# Patient Record
Sex: Female | Born: 1970 | Race: White | Hispanic: No | Marital: Married | Smoking: Never smoker
Health system: Southern US, Community
[De-identification: ages and names within clinical notes are randomized; demographics above are authoritative.]

## PROBLEM LIST (undated history)

## (undated) DIAGNOSIS — Z889 Allergy status to unspecified drugs, medicaments and biological substances status: Secondary | ICD-10-CM

## (undated) DIAGNOSIS — F329 Major depressive disorder, single episode, unspecified: Secondary | ICD-10-CM

## (undated) DIAGNOSIS — F32A Depression, unspecified: Secondary | ICD-10-CM

## (undated) DIAGNOSIS — G43909 Migraine, unspecified, not intractable, without status migrainosus: Secondary | ICD-10-CM

---
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
## 1999-07-24 ENCOUNTER — Other Ambulatory Visit: Admission: RE | Admit: 1999-07-24 | Discharge: 1999-07-24 | Payer: Self-pay | Admitting: Gynecology

## 1999-09-16 ENCOUNTER — Other Ambulatory Visit: Admission: RE | Admit: 1999-09-16 | Discharge: 1999-09-16 | Payer: Self-pay | Admitting: Gynecology

## 1999-12-30 ENCOUNTER — Other Ambulatory Visit: Admission: RE | Admit: 1999-12-30 | Discharge: 1999-12-30 | Payer: Self-pay | Admitting: Gynecology

## 2016-01-22 ENCOUNTER — Other Ambulatory Visit: Payer: Self-pay | Admitting: Family Medicine

## 2016-01-22 DIAGNOSIS — E041 Nontoxic single thyroid nodule: Secondary | ICD-10-CM

## 2016-01-29 ENCOUNTER — Ambulatory Visit
Admission: RE | Admit: 2016-01-29 | Discharge: 2016-01-29 | Disposition: A | Discharge status: Home / Self Care | Payer: 59 | Source: Ambulatory Visit | Attending: Family Medicine | Admitting: Family Medicine

## 2016-01-29 DIAGNOSIS — E041 Nontoxic single thyroid nodule: Secondary | ICD-10-CM

## 2016-02-04 ENCOUNTER — Other Ambulatory Visit: Payer: Self-pay | Admitting: Family Medicine

## 2016-02-04 DIAGNOSIS — E041 Nontoxic single thyroid nodule: Secondary | ICD-10-CM

## 2016-02-12 ENCOUNTER — Other Ambulatory Visit (HOSPITAL_COMMUNITY)
Admission: RE | Admit: 2016-02-12 | Discharge: 2016-02-12 | Disposition: A | Discharge status: Home / Self Care | Payer: 59 | Source: Ambulatory Visit | Attending: General Surgery | Admitting: General Surgery

## 2016-02-12 ENCOUNTER — Ambulatory Visit
Admission: RE | Admit: 2016-02-12 | Discharge: 2016-02-12 | Disposition: A | Discharge status: Home / Self Care | Payer: 59 | Source: Ambulatory Visit | Attending: Family Medicine | Admitting: Family Medicine

## 2016-02-12 DIAGNOSIS — E041 Nontoxic single thyroid nodule: Secondary | ICD-10-CM

## 2016-11-25 IMAGING — US US THYROID BIOPSY
1 series · 13 of 13 positions shown · non-contrast
Comparison: US on 01/29/2016

INDICATION: Found to have a right thyroid nodule on recent US. Biposy requested
to determine pathology of this nodule.

EXAM:
ULTRASOUND GUIDED NEEDLE ASPIRATE BIOPSY OF THE RIGHT THYROID GLAND
NODULE

[Series 1: us thyroid biopsy · 0.06mm/px · 13 acquisitions, 13 frames shown]
[im 1/13]
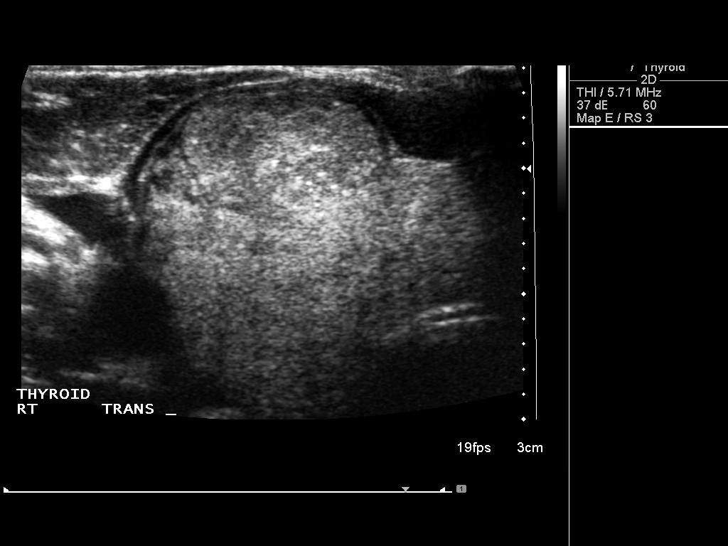
[im 2/13]
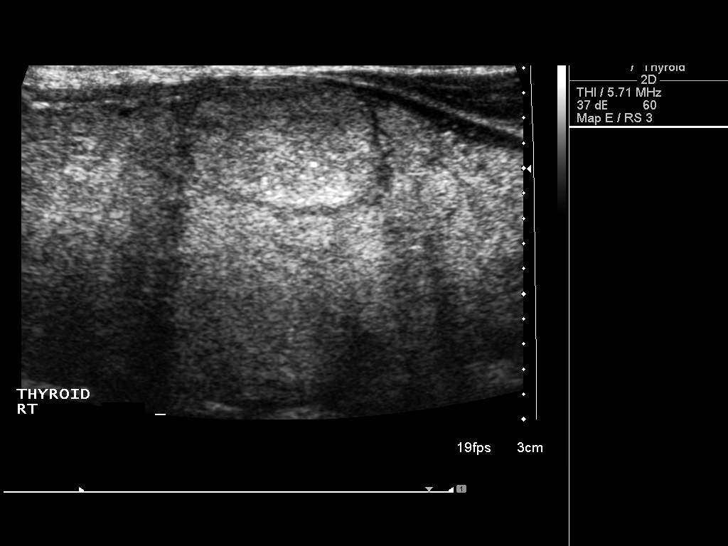
[im 3/13]
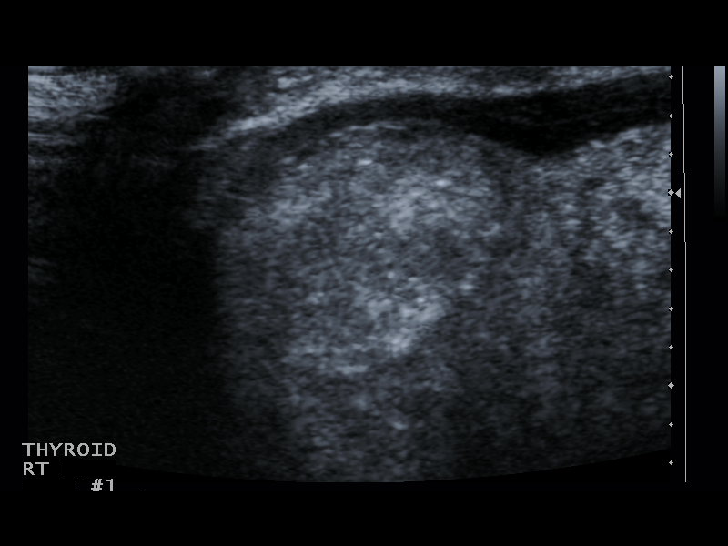
[im 4/13]
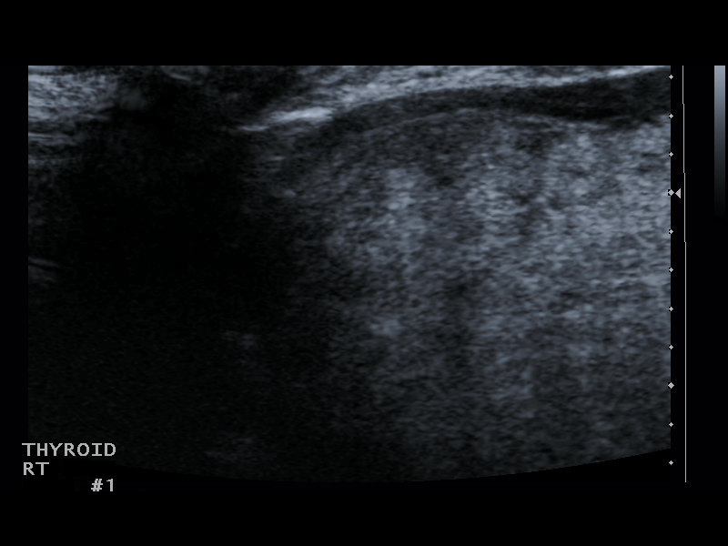
[im 5/13]
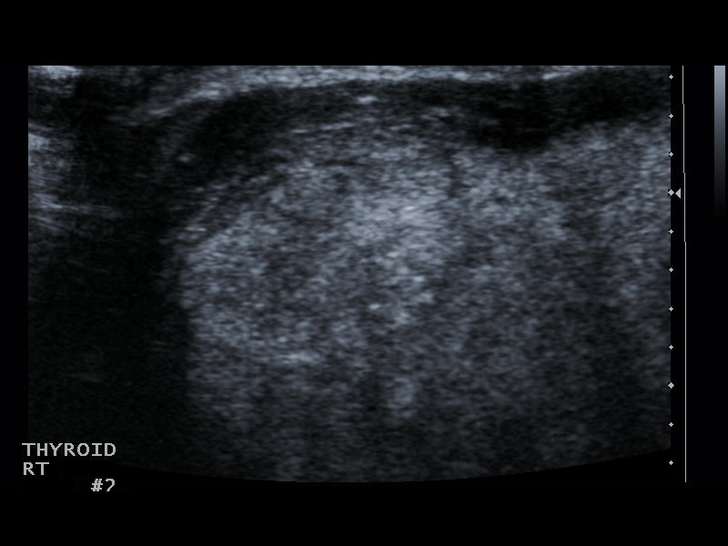
[im 6/13]
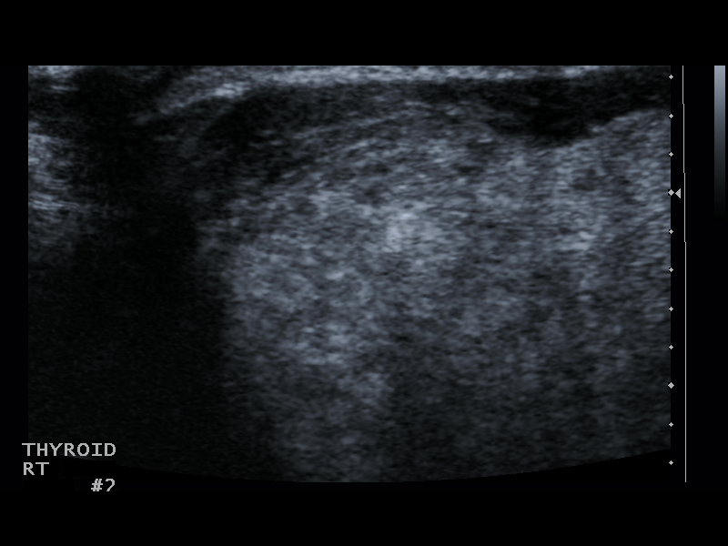
[im 7/13]
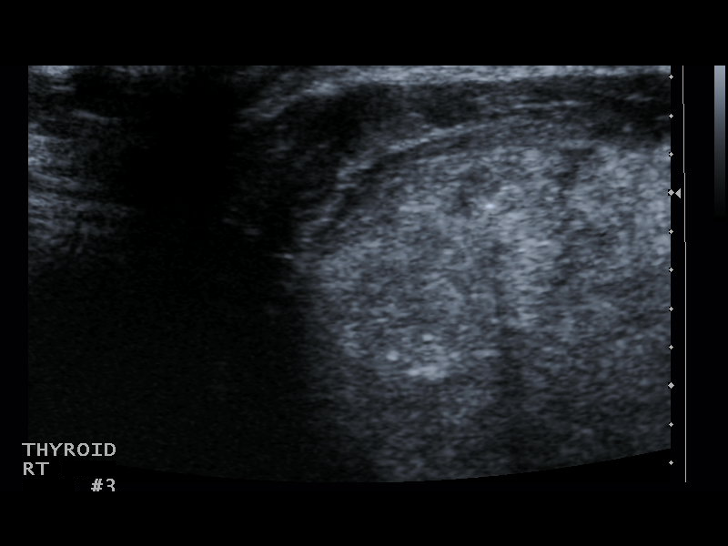
[im 8/13]
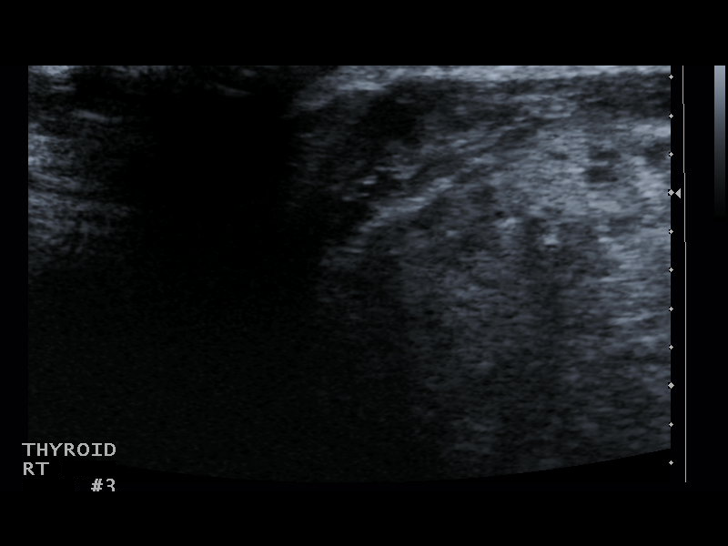
[im 9/13]
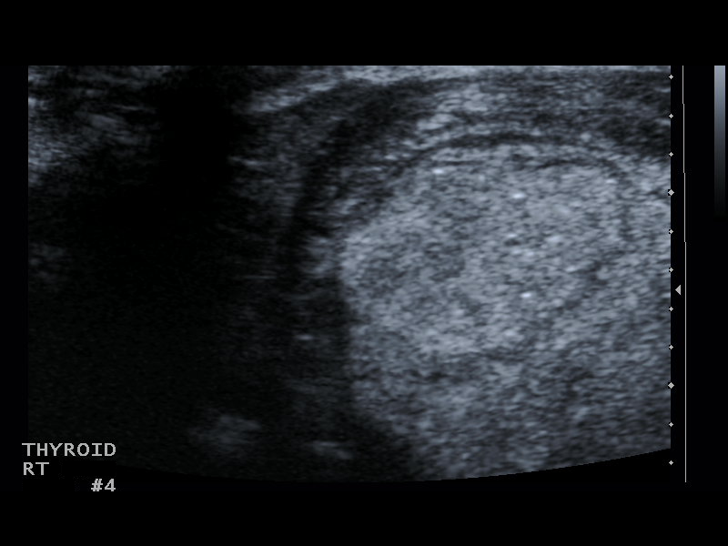
[im 10/13]
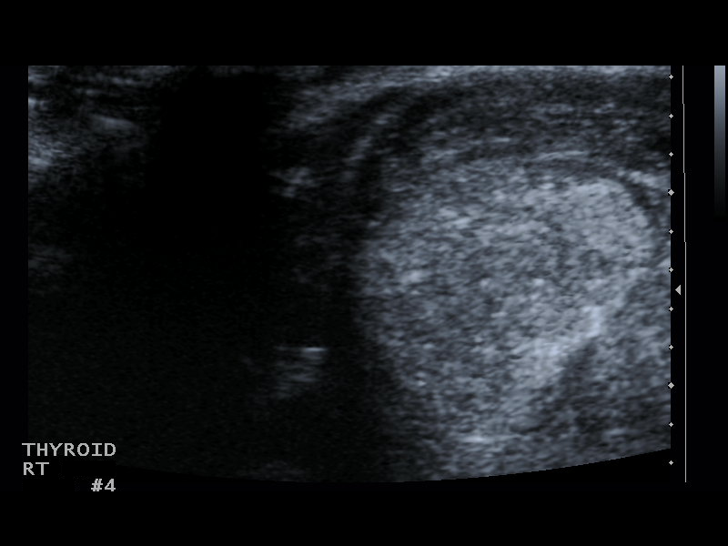
[im 11/13]
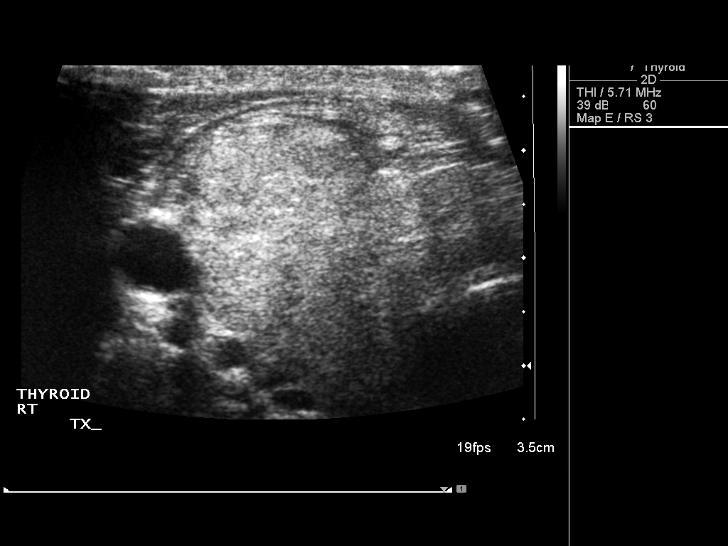
[im 12/13]
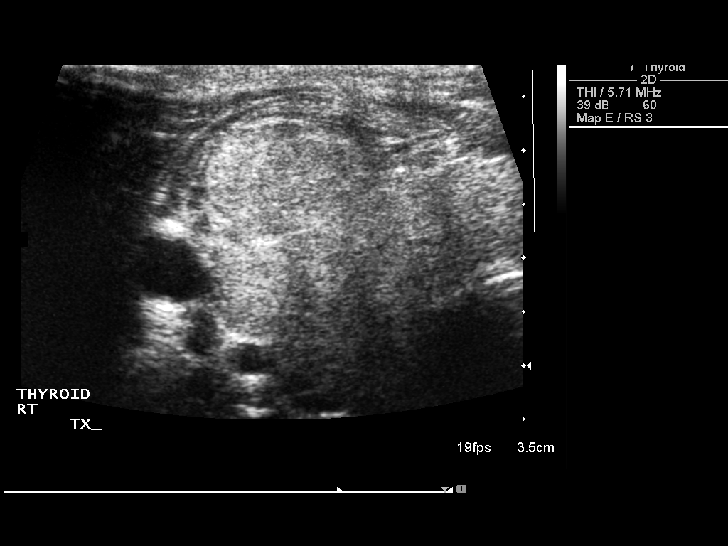
[im 13/13]
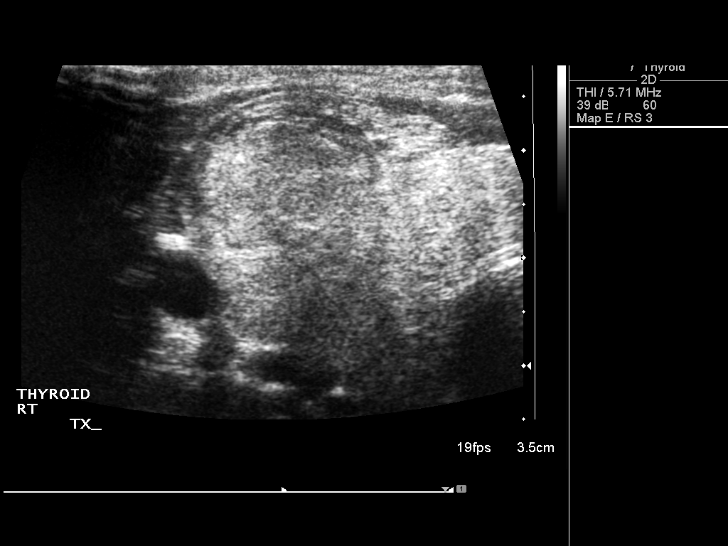

[13 of 13 positions shown; findings below may reference images not displayed]

MEDICATIONS:
1% lidocaine

COMPLICATIONS:
None immediate.

PROCEDURE:
Informed written consent was obtained from the patient after a
thorough discussion of the procedural risks, benefits and
alternatives. All questions were addressed. Maximal Sterile Barrier
Technique was utilized including mask, sterile gloves, sterile
drape, hand hygiene and skin antiseptic. A timeout was performed
prior to the initiation of the procedure.

Ultrasound was performed to localize and mark an adequate site for
the biopsy. The patient was then prepped and draped in a normal
sterile fashion. Local anesthesia was provided with 1% lidocaine.
Using direct ultrasound guidance, 4 passes were made using 25G
needles into the nodule within the right lobe of the thyroid.
Ultrasound was used to confirm needle placements on all occasions.
Specimens were sent to Pathology for analysis.
IMPRESSION: Ultrasound guided needle aspirate biopsy performed of the right
thyroid nodule.

## 2017-01-14 DIAGNOSIS — R0600 Dyspnea, unspecified: Secondary | ICD-10-CM | POA: Diagnosis not present

## 2017-01-14 DIAGNOSIS — R5383 Other fatigue: Secondary | ICD-10-CM | POA: Diagnosis not present

## 2017-01-14 DIAGNOSIS — N644 Mastodynia: Secondary | ICD-10-CM | POA: Diagnosis not present

## 2017-01-14 DIAGNOSIS — E041 Nontoxic single thyroid nodule: Secondary | ICD-10-CM | POA: Diagnosis not present

## 2017-01-18 DIAGNOSIS — R922 Inconclusive mammogram: Secondary | ICD-10-CM | POA: Diagnosis not present

## 2017-01-18 DIAGNOSIS — N644 Mastodynia: Secondary | ICD-10-CM | POA: Diagnosis not present

## 2017-01-24 DIAGNOSIS — Z0000 Encounter for general adult medical examination without abnormal findings: Secondary | ICD-10-CM | POA: Diagnosis not present

## 2017-01-24 DIAGNOSIS — E041 Nontoxic single thyroid nodule: Secondary | ICD-10-CM | POA: Diagnosis not present

## 2017-01-29 DIAGNOSIS — N390 Urinary tract infection, site not specified: Secondary | ICD-10-CM | POA: Diagnosis not present

## 2017-08-08 DIAGNOSIS — J3489 Other specified disorders of nose and nasal sinuses: Secondary | ICD-10-CM | POA: Diagnosis not present

## 2017-08-08 DIAGNOSIS — R5383 Other fatigue: Secondary | ICD-10-CM | POA: Diagnosis not present

## 2018-02-28 DIAGNOSIS — Z0000 Encounter for general adult medical examination without abnormal findings: Secondary | ICD-10-CM | POA: Diagnosis not present

## 2018-02-28 DIAGNOSIS — E041 Nontoxic single thyroid nodule: Secondary | ICD-10-CM | POA: Diagnosis not present

## 2018-05-30 DIAGNOSIS — N951 Menopausal and female climacteric states: Secondary | ICD-10-CM | POA: Diagnosis not present

## 2018-05-30 DIAGNOSIS — N643 Galactorrhea not associated with childbirth: Secondary | ICD-10-CM | POA: Diagnosis not present

## 2018-06-12 DIAGNOSIS — N643 Galactorrhea not associated with childbirth: Secondary | ICD-10-CM | POA: Diagnosis not present

## 2018-06-12 DIAGNOSIS — R922 Inconclusive mammogram: Secondary | ICD-10-CM | POA: Diagnosis not present

## 2018-06-12 DIAGNOSIS — N6489 Other specified disorders of breast: Secondary | ICD-10-CM | POA: Diagnosis not present

## 2018-06-12 DIAGNOSIS — N6452 Nipple discharge: Secondary | ICD-10-CM | POA: Diagnosis not present

## 2018-09-30 DIAGNOSIS — Z23 Encounter for immunization: Secondary | ICD-10-CM | POA: Diagnosis not present

## 2018-11-16 DIAGNOSIS — J019 Acute sinusitis, unspecified: Secondary | ICD-10-CM | POA: Diagnosis not present

## 2019-03-29 DIAGNOSIS — F3340 Major depressive disorder, recurrent, in remission, unspecified: Secondary | ICD-10-CM | POA: Diagnosis not present

## 2019-07-16 ENCOUNTER — Emergency Department (HOSPITAL_BASED_OUTPATIENT_CLINIC_OR_DEPARTMENT_OTHER): Admission: EM | Admit: 2019-07-16 | Payer: Self-pay | Source: Home / Self Care

## 2019-07-16 ENCOUNTER — Other Ambulatory Visit: Payer: Self-pay

## 2019-07-16 ENCOUNTER — Emergency Department (HOSPITAL_BASED_OUTPATIENT_CLINIC_OR_DEPARTMENT_OTHER): Payer: BC Managed Care – PPO

## 2019-07-16 ENCOUNTER — Encounter (HOSPITAL_BASED_OUTPATIENT_CLINIC_OR_DEPARTMENT_OTHER): Payer: Self-pay

## 2019-07-16 ENCOUNTER — Emergency Department (HOSPITAL_COMMUNITY)
Admission: EM | Admit: 2019-07-16 | Discharge: 2019-07-17 | Disposition: A | Discharge status: Home / Self Care | Payer: BC Managed Care – PPO | Source: Home / Self Care | Attending: Emergency Medicine | Admitting: Emergency Medicine

## 2019-07-16 ENCOUNTER — Emergency Department (HOSPITAL_BASED_OUTPATIENT_CLINIC_OR_DEPARTMENT_OTHER)
Admission: EM | Admit: 2019-07-16 | Discharge: 2019-07-16 | Disposition: A | Discharge status: Home / Self Care | Payer: BC Managed Care – PPO | Attending: Emergency Medicine | Admitting: Emergency Medicine

## 2019-07-16 ENCOUNTER — Encounter (HOSPITAL_COMMUNITY): Payer: Self-pay | Admitting: Emergency Medicine

## 2019-07-16 DIAGNOSIS — R1310 Dysphagia, unspecified: Secondary | ICD-10-CM | POA: Insufficient documentation

## 2019-07-16 DIAGNOSIS — K0889 Other specified disorders of teeth and supporting structures: Secondary | ICD-10-CM

## 2019-07-16 DIAGNOSIS — Z79899 Other long term (current) drug therapy: Secondary | ICD-10-CM | POA: Insufficient documentation

## 2019-07-16 DIAGNOSIS — E079 Disorder of thyroid, unspecified: Secondary | ICD-10-CM | POA: Diagnosis not present

## 2019-07-16 DIAGNOSIS — E049 Nontoxic goiter, unspecified: Secondary | ICD-10-CM | POA: Diagnosis not present

## 2019-07-16 HISTORY — DX: Depression, unspecified: F32.A

## 2019-07-16 HISTORY — DX: Migraine, unspecified, not intractable, without status migrainosus: G43.909

## 2019-07-16 HISTORY — DX: Allergy status to unspecified drugs, medicaments and biological substances: Z88.9

## 2019-07-16 LAB — BASIC METABOLIC PANEL
Anion gap: 12 (ref 5–15)
BUN: 9 mg/dL (ref 6–20)
CO2: 23 mmol/L (ref 22–32)
Calcium: 9.2 mg/dL (ref 8.9–10.3)
Chloride: 103 mmol/L (ref 98–111)
Creatinine, Ser: 0.66 mg/dL (ref 0.44–1.00)
GFR calc Af Amer: >60 mL/min (ref >60)
GFR calc non Af Amer: >60 mL/min (ref >60)
Glucose, Bld: 112 mg/dL — ABNORMAL HIGH (ref 70–99)
Potassium: 3.8 mmol/L (ref 3.5–5.1)
Sodium: 138 mmol/L (ref 135–145)

## 2019-07-16 LAB — CBC WITH DIFFERENTIAL/PLATELET
Abs Immature Granulocytes: 0.04 10*3/uL (ref 0.00–0.07)
Basophils Absolute: 0 10*3/uL (ref 0.0–0.1)
Basophils Relative: 0 %
Eosinophils Absolute: 0.1 10*3/uL (ref 0.0–0.5)
Eosinophils Relative: 1 %
HCT: 39.2 % (ref 36.0–46.0)
Hemoglobin: 13 g/dL (ref 12.0–15.0)
Immature Granulocytes: 0 %
Lymphocytes Relative: 9 %
Lymphs Abs: 0.8 10*3/uL (ref 0.7–4.0)
MCH: 32.1 pg (ref 26.0–34.0)
MCHC: 33.2 g/dL (ref 30.0–36.0)
MCV: 96.8 fL (ref 80.0–100.0)
Monocytes Absolute: 0.8 10*3/uL (ref 0.1–1.0)
Monocytes Relative: 8 %
Neutro Abs: 7.9 10*3/uL — ABNORMAL HIGH (ref 1.7–7.7)
Neutrophils Relative %: 82 %
Platelets: 224 10*3/uL (ref 150–400)
RBC: 4.05 MIL/uL (ref 3.87–5.11)
RDW: 12 % (ref 11.5–15.5)
WBC: 9.8 10*3/uL (ref 4.0–10.5)
nRBC: 0 % (ref 0.0–0.2)

## 2019-07-16 LAB — CBG MONITORING, ED: Glucose-Capillary: 97 mg/dL (ref 70–99)

## 2019-07-16 MED ORDER — ONDANSETRON HCL 4 MG/2ML IJ SOLN
4.0000 mg | Freq: Once | INTRAMUSCULAR | Status: AC
  Administered 2019-07-16: 15:00:00 4 mg via INTRAVENOUS
  Filled 2019-07-16: qty 2

## 2019-07-16 MED ORDER — MORPHINE SULFATE (PF) 2 MG/ML IV SOLN
2.0000 mg | Freq: Once | INTRAVENOUS | Status: AC
  Administered 2019-07-16: 16:00:00 2 mg via INTRAVENOUS
  Filled 2019-07-16: qty 1

## 2019-07-16 MED ORDER — HYDROCODONE-ACETAMINOPHEN 5-325 MG PO TABS
1.0000 | ORAL_TABLET | Freq: Once | ORAL | Status: AC
  Administered 2019-07-16: 17:00:00 1 via ORAL
  Filled 2019-07-16: qty 1

## 2019-07-16 MED ORDER — CLINDAMYCIN HCL 150 MG PO CAPS: 450.0000 mg | ORAL_CAPSULE | Freq: Three times a day (TID) | ORAL | 0 refills | Status: AC

## 2019-07-16 MED ORDER — CLINDAMYCIN PHOSPHATE 900 MG/50ML IV SOLN
900.0000 mg | Freq: Once | INTRAVENOUS | Status: AC
  Administered 2019-07-16: 900 mg via INTRAVENOUS
  Filled 2019-07-16: qty 50

## 2019-07-16 MED ORDER — IOHEXOL 300 MG/ML  SOLN
100.0000 mL | Freq: Once | INTRAMUSCULAR | Status: AC | PRN
  Administered 2019-07-16: 75 mL via INTRAVENOUS

## 2019-07-16 MED ORDER — KETOROLAC TROMETHAMINE 30 MG/ML IJ SOLN
30.0000 mg | Freq: Once | INTRAMUSCULAR | Status: AC
  Administered 2019-07-16: 30 mg via INTRAVENOUS
  Filled 2019-07-16: qty 1

## 2019-07-16 NOTE — ED Notes (Signed)
Patient transported to CT 

## 2019-07-16 NOTE — ED Provider Notes (Signed)
[facility] EMERGENCY DEPARTMENT Provider Note   CSN: [id number] Arrival date & time: 07/16/19  1221    History   Chief Complaint Chief Complaint  Patient presents with   Dental Problem    HPI Crystal Callahan is a 48 y.o. female.     HPI   Patient is a 48 year old female who presents the emergency department today complaining of right lower dental pain.  States that she had a fractured tooth and had it removed on 07/14/2019 by a dentist.  Since the procedure she has developed swelling and pain to the right lower part of the mouth.  States it somewhat difficult to swallow.  She was seen by another dentist today who advised her to come to the ED for further work-up and evaluation and potentially IV antibiotics.  She denies any fevers.  She is still able to open her mouth and tolerate her own secretions.  Denies any other symptoms.  Past Medical History:  Diagnosis Date   Depression    Hx of seasonal allergies    Migraine     There are no active problems to display for this patient.   History reviewed. No pertinent surgical history.   OB History   No obstetric history on file.      Home Medications    Prior to Admission medications   Medication Sig Start Date End Date Taking? Authorizing Provider  Ascorbic Acid (VITAMIN C) 100 MG tablet Take by mouth.    [provider]  b complex vitamins tablet Take by mouth.    [provider]  clindamycin (CLEOCIN) 150 MG capsule Take 3 capsules (450 mg total) by mouth 3 (three) times daily for 7 days. 07/16/19 07/23/19  Derricka Mertz S, PA-C  loratadine (CLARITIN) 10 MG tablet Take by mouth.    [provider]  Multiple Vitamins-Minerals (THERA-M) TABS Take by mouth.    [provider]  venlafaxine XR (EFFEXOR-XR) 75 MG 24 hr capsule  02/23/19   [provider]    Family History No family history on file.  Social History Social History   Tobacco Use   Smoking status:  Never Smoker   Smokeless tobacco: Never Used  Substance Use Topics   Alcohol use: Yes    Comment: weekly   Drug use: Never     Allergies   Demerol [meperidine hcl]   Review of Systems Review of Systems  Constitutional: Negative for chills and fever.  HENT: Positive for dental problem and trouble swallowing. Negative for voice change.   Eyes: Negative for visual disturbance.  Respiratory: Negative for shortness of breath.   Cardiovascular: Negative for chest pain.  Gastrointestinal: Negative for abdominal pain, nausea and vomiting.  Genitourinary: Negative for dysuria.  Musculoskeletal: Negative for back pain.  Skin: Negative for color change.  Neurological: Negative for headaches.     Physical Exam Updated Vital Signs BP 127/77 (BP Location: Left Arm)    Pulse 91    Temp 99.1 F (37.3 C) (Axillary)    Resp 12    Ht 5\' 9"  (1.753 m)    Wt 70.3 kg    LMP  (LMP Unknown)    SpO2 100%    BMI 22.89 kg/m   Physical Exam Vitals signs and nursing note reviewed.  Constitutional:      General: She is not in acute distress.    Appearance: She is well-developed.  HENT:     Head: Normocephalic and atraumatic.     Mouth/Throat:  Comments: TTP to right lower gums along surgical site. No obvious fluctuance noted. Torus' noted to the sublingual space. No sublingual edema, minimal TTP. Swelling noted to the right lower face that is TTP. No significant swelling/erythema to the neck  Eyes:     Conjunctiva/sclera: Conjunctivae normal.  Neck:     Musculoskeletal: Neck supple.  Cardiovascular:     Rate and Rhythm: Normal rate and regular rhythm.     Heart sounds: No murmur.  Pulmonary:     Effort: Pulmonary effort is normal. No respiratory distress.     Breath sounds: Normal breath sounds.  Abdominal:     General: Bowel sounds are normal.     Palpations: Abdomen is soft.     Tenderness: There is no abdominal tenderness.  Skin:    General: Skin is warm and dry.  Neurological:       Mental Status: She is alert.     ED Treatments / Results  Labs (all labs ordered are listed, but only abnormal results are displayed) Labs Reviewed  CBC WITH DIFFERENTIAL/PLATELET - Abnormal; Notable for the following components:      Result Value   Neutro Abs 7.9 (*)    All other components within normal limits  BASIC METABOLIC PANEL - Abnormal; Notable for the following components:   Glucose, Bld 112 (*)    All other components within normal limits  CULTURE, BLOOD (ROUTINE X 2)  CULTURE, BLOOD (ROUTINE X 2)  PREGNANCY, URINE  CBG MONITORING, ED    EKG None  Radiology Ct Soft Tissue Neck W Contrast  Result Date: 07/16/2019 CLINICAL DATA:  Recent right tooth extraction 07/14/2019. Now with soft tissue swelling right jaw. Pain. EXAM: CT NECK WITH CONTRAST TECHNIQUE: Multidetector CT imaging of the neck was performed using the standard protocol following the bolus administration of intravenous contrast. CONTRAST:  26Nicole [id number]-RosEra Bumper45R[facility] View Surgery CenterMarcelle OvEstreHenVKenmare Community Hospit69mNicole Kindre16191Swazi1610-Clearlake Era Bumper45[facility] ALPenaMarcelle OvEstreHenVSkyline Ambulatory Surgery Cent63mNicole Kindre16191Swazi1610-OstraEra Bumper45[facility] HospitalMarcelle OvEstreHenVResurgens Fayette Surgery Center L37mNicole [id number]-PardeesvEra Bumper45[facility] HospitalMarcelle OvEstreHenVSt. Claire Regional Medical Cent42mNicole [id number]-North BlenEra Bumper45[facility] Medical CenterMarcelle OvEstreHenVCumberland Valley Surgical Center L12mNicole Kindre16191Swazi1610-GalEra Bumper45[facility] LLCMarcelle OvEstreHenVO'Connor Hospit30mNicole Kindre16191Swazi1610-[place] PEra Bumper45[facility] LLCMarcelle OvEstreHenVSchaumburg Surgery Cent68mNicole [id number]-LauderEra Bumper45[facility] LPMarcelle OvEstreHenVMelville Garey L64mNicole Kindre16191Swazi1610-[place] AEra Bumper45[facility] CenterMarcelle OvEstreHenVMs Methodist Rehabilitation Cent24mNicole Kindre16191Swazi1610-Grand CoEra Bumper45North Dakota Surgery Center LLCMarcelle OvEstreHenVNorth Point Surgery Cent38mNicole Kindre16191Swazi1610-JohnEra Bumper45St. [facility] HospitalMarcelle OvEstreHenVMid [facility] L65mNicole Kindre16191Swazi1610-WhitehEra Bumper45[facility] Surgery CtrMarcelle OvEstreHenVPennsylvania Psychiatric Institu71mNicole Kindre16191Swazi1610-BarEra Bumper45[facility] Medical CenterMarcelle OvEstreHenVEncompass Health Rehabilitation Hospital Of Montgome69mNicole Kindre16191Swazi1610-Kenneth Era Bumper45White River Medical CenterMarcelle OvEstreHenVPark Place Surgical Hospit60mNicole Kindre16191Swazi1610-PampEra Bumper45Faulkner HospitalMarcelle OvEstreHenVMissoula Bone And Joint Surgery Cent74mNicole Kindre16191Swazi1610-RocheEra Bumper45[facility] Memorial HospitalMarcelle OvEstreHenVHouston Medical Cent34mNicole Kindre16191Swazi1610-Glenn SprEra Bumper45[facility]ty HospitalMarcelle OvEstreHenVKennedy Kreiger Institu58mNicole Kindre16191Swazi1610-HerculaEra Bumper45Advanced Surgery CenterMarcelle OvEstreHenVMt [facility] L61mNicole Kindre16191Swazi1610-RoseEra Bumper45Pinckney[facility] Community HospitalMarcelle OvEstreHenVSurgery And Laser Center At Professional [facility] L54mNicole Kindre16191Swazi1610-SlEra Bumper452020 Surgery Center LLCMarcelle OvEstreHenVCommunity Hospit52mNicole Kindre16191Swazi1610-LEra Bumper45B[facility] Medical CenterMarcelle OvEstreHenVHosp San Cristob66mNicole Kindre16191Swazi1610-BeattyvEra Bumper45[facility] HospitalMarcelle OvEstreHenVMackinaw Surgery Center L29mNicole Kindre16191Swazi1610-SEra Bumper45Loring HospitalMarcelle OvEstreHenVBaylor Surgicare At [facility] L8mNicole Kindre16191Swazi1610-TreEra Bumper45[facility] Surgery CenterMarcelle OvEstreHenVJohns Hopkins Hospit3mNicole Kindre16191Swazi1610-NorthEra Bumper45[facility] BartlettMarcelle OvEstreHenVNew Horizons Of [facility] - [facility] Health Cent39mNicole Kindre16191Swazi1610-Three RiEra Bumper45[facility]ists IncMarcelle OvEstreHenVMountain View Hospit76mNicole Kindre16191Swazi1610-OlEra Bumper45[facility] Ii PaMarcelle OvEstreHenVJefferson Community Health Cent14mNicole Kindre16191Swazi1610-Tanque VEra Bumper45[facility] Medical CenterMarcelle OvEstreHenVPiney Orchard Surgery Center [id number] 300 MG/ML  SOLN COMPARISON:  None. FINDINGS: Pharynx and larynx: Normal. No mass or swelling. Salivary glands: No inflammation, mass, or stone. Thyroid: Thyroid goiter with diffuse enlargement of the thyroid. 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 Lymph nodes: No enlarged lymph nodes. Vascular: Normal vascular enhancement. Limited intracranial: Negative Visualized orbits: Negative Mastoids and visualized paranasal sinuses: Negative Skeleton: Recent extraction of right lower molar. No fracture identified. No other areas of acute dental infection. No periapical abscess Upper chest: Negative Other: Mild soft tissue edema and swelling below the right mandible. No soft tissue abscess. IMPRESSION: 1. Recent extraction right lower molar. No fracture or periapical abscess. No soft tissue abscess. There is stranding in the soft tissues below to the mandible on the right which may be bruising or due to cellulitis. 2. Thyroid goiter. 2.5 cm right  thyroid mass recommend thyroid ultrasound and possible biopsy. Electronically Signed   By: Charles  Clark M.D.   On: 07/16/2019 15:23    Procedures Procedures (including critical care time)  Medications Ordered in ED Medications  HYDROcodone-acetaminophen (NORCO/VICODIN) 5-325 MG per tablet 1 tablet (has no administration in time range)  clindamycin (CLEOCIN) IVPB 900 mg (0 mg Intravenous Stopped 07/16/19 1506)  ketorolac (TORADOL) 30 MG/ML injection 30 mg (30 mg Intravenous Given 07/16/19 1409)  iohexol (OMNIPAQUE) 300 MG/ML solution 100 mL (75 mLs Intravenous Contrast Given 07/16/19 1456)  ondansetron (ZOFRAN) injection 4 mg (4 mg Intravenous Given 07/16/19 1519)  morphine 2 MG/ML injection 2 mg (2 mg Intravenous Given 07/16/19 1616)     Initial Impression / Assessment and Plan / ED Course  I have  reviewed the triage vital signs and the nursing notes.  Pertinent labs & imaging results that were available during my care of the patient were reviewed by me and considered in my medical decision making (see chart for details).   Final Clinical Impressions(s) / ED Diagnoses   Final diagnoses:  Pain, dental   Pt underwent dental procedure 3 days ago and had tooth extraction. Has developed pain and swelling to the area and now how swelling to the face. Was seen by a different dentist today and sent to the ED for further eval.   Arrives afebrile, hypertensive, but otherwise normal VS.   On exam, TTP to right lower gums along surgical site. No obvious fluctuance noted. Torus' noted to the sublingual space. No sublingual edema, minimal TTP. Swelling noted to the right lower face that is TTP. No significant swelling/erythema to the neck   Will obtain labs and imaging of the neck. Will give abx and pain meds  CBC without leukocytosis. BMP nonacute  CT soft tissue neck 1. Recent extraction right lower molar. No fracture or periapical abscess. No soft tissue abscess. There is stranding in the soft  tissues below to the mandible on the right which may be bruising or due to cellulitis. 2. Thyroid goiter. 2.5 cm right thyroid mass recommend thyroid ultrasound and possible biopsy.  Discussed case with Dr. Haig Prophet with adult dental who is in agreement that this is less likely Ludwig's and that patient can likely be started on antibiotics and follow-up as an outpatient.  Discussed case with Dr. Hoyt Koch with oral surgery who is in agreement with plan to discharge patient on clindamycin.  He can see her in the office tomorrow if she prefers to follow-up.  On reassessment, swelling has remained stable.  Patient still able to tolerate secretions.  Discussed findings of work-up and plan for discharge on clindamycin.  I also discussed the findings of her thyroid goiter and mass.  She states she has already had ultrasound and biopsy and this is being followed by her primary care doctor.  Gave her information to follow-up with oral surgery tomorrow.  Advised on return precautions.  She voices understanding the plan and reasons return.  All questions answered.  Patient stable for discharge.  ED Discharge Orders         Ordered    clindamycin (CLEOCIN) 150 MG capsule  3 times daily     07/16/19 [address] Shaw Dobek S, PA-C 07/16/19 1641    Lajean Saver, MD 07/17/19 1545

## 2019-07-16 NOTE — ED Provider Notes (Signed)
[facility] DEPT Provider Note   CSN: [id number] Arrival date & time: 07/16/19  2214    History   Chief Complaint Chief Complaint  Patient presents with  . Dental Pain    HPI Crystal Callahan is a 48 y.o. female.     HPI   Crystal Callahan is a 48 y.o. female, with a history of recent dental extraction and migraines, presenting to the ED with persistent right-sided dental and facial pain.  Her pain is severe, throbbing/burning, radiating along the right mandible and right cheek. Patient underwent dental extraction August 1.  She began to have more swelling and pain.  She was unable to get a hold of the original dentist today as their office was closed so this morning she went to another dentist recommended to her by a family member.  This dentist sent her to the ED for further evaluation. She was seen at [facility] where a CT showed no evidence of Ludwig's angioedema or abscess.  She was started on clindamycin and told to follow-up in the office with Dr. Hoyt Koch, oral surgeon, tomorrow.  She presents to the ED today because her pain was unmanageable at home with her instructed ibuprofen and Tylenol.  Denies fever/chills, difficulty swallowing, shortness of breath, chest pain, drooling, facial droop, or any other complaints.    Past Medical History:  Diagnosis Date  . Depression   . Hx of seasonal allergies   . Migraine     There are no active problems to display for this patient.   History reviewed. No pertinent surgical history.   OB History   No obstetric history on file.      Home Medications    Prior to Admission medications   Medication Sig Start Date End Date Taking? Authorizing Provider  Ascorbic Acid (VITAMIN C) 100 MG tablet Take by mouth.    [provider]  b complex vitamins tablet Take by mouth.    [provider]  clindamycin (CLEOCIN) 150 MG capsule Take 3 capsules (450 mg total) by mouth 3  (three) times daily for 7 days. 07/16/19 07/23/19  Couture, Cortni S, PA-C  lidocaine (XYLOCAINE) 2 % solution Use as directed 15 mLs in the mouth or throat as needed for mouth pain. 07/17/19   General Wearing C, PA-C  loratadine (CLARITIN) 10 MG tablet Take by mouth.    [provider]  Multiple Vitamins-Minerals (THERA-M) TABS Take by mouth.    [provider]  ondansetron (ZOFRAN ODT) 4 MG disintegrating tablet Take 1 tablet (4 mg total) by mouth every 8 (eight) hours as needed for nausea or vomiting. 07/17/19   Helyn Schwan C, PA-C  oxyCODONE-acetaminophen (PERCOCET/ROXICET) 5-325 MG tablet Take 1-2 tablets by mouth every 6 (six) hours as needed for severe pain. 07/17/19   Raynie Steinhaus, Helane Gunther, PA-C  venlafaxine XR (EFFEXOR-XR) 75 MG 24 hr capsule  02/23/19   [provider]    Family History No family history on file.  Social History Social History   Tobacco Use  . Smoking status: Never Smoker  . Smokeless tobacco: Never Used  Substance Use Topics  . Alcohol use: Yes    Comment: weekly  . Drug use: Never     Allergies   Demerol [meperidine hcl]   Review of Systems Review of Systems  Constitutional: Negative for chills and fever.  HENT: Positive for dental problem and facial swelling. Negative for drooling and trouble swallowing.   Respiratory: Negative for shortness  of breath.   Cardiovascular: Negative for chest pain.  Gastrointestinal: Negative for nausea and vomiting.  Neurological: Negative for dizziness and light-headedness.  All other systems reviewed and are negative.    Physical Exam Updated Vital Signs BP (!) 146/96 (BP Location: Right Arm)   Pulse 91   Temp 99 F (37.2 C) (Oral)   Resp 19   LMP  (LMP Unknown)   SpO2 100%   Physical Exam Vitals signs and nursing note reviewed.  Constitutional:      General: She is not in acute distress.    Appearance: She is well-developed. She is not diaphoretic.  HENT:     Head: Normocephalic and atraumatic.      Mouth/Throat:     Mouth: Mucous membranes are moist.     Pharynx: Oropharynx is clear.     Comments: Tenderness to the right mandibular gingiva with some swelling.  No noted fluctuance.   Extraction site appears to be in the region of right mandibular molar.  No hemorrhage.  Tenderness and swelling to the right cheek.  There is tenderness along the mandible as well.  No trismus or noted abnormal phonation.  Mouth opening to at least 3 finger widths.  Handles oral secretions without difficulty.  No sublingual swelling.  No swelling or tenderness to the submental or submandibular regions.  No swelling or tenderness into the soft tissues of the neck. Eyes:     Conjunctiva/sclera: Conjunctivae normal.  Neck:     Musculoskeletal: Neck supple.  Cardiovascular:     Rate and Rhythm: Normal rate and regular rhythm.     Pulses: Normal pulses.          Radial pulses are 2+ on the right side and 2+ on the left side.     Comments: Tactile temperature in the extremities appropriate and equal bilaterally. Pulmonary:     Effort: Pulmonary effort is normal. No respiratory distress.  Abdominal:     Tenderness: There is no guarding.  Musculoskeletal:     Right lower leg: No edema.     Left lower leg: No edema.  Lymphadenopathy:     Cervical: No cervical adenopathy.  Skin:    General: Skin is warm and dry.  Neurological:     Mental Status: She is alert.  Psychiatric:        Mood and Affect: Mood and affect normal.        Speech: Speech normal.        Behavior: Behavior normal.      ED Treatments / Results  Labs (all labs ordered are listed, but only abnormal results are displayed) Labs Reviewed - No data to display  EKG None  Radiology Ct Soft Tissue Neck W Contrast  Result Date: 07/16/2019 CLINICAL DATA:  Recent right tooth extraction 07/14/2019. Now with soft tissue swelling right jaw. Pain. EXAM: CT NECK WITH CONTRAST TECHNIQUE: Multidetector CT imaging of the neck was performed  using the standard protocol following the bolus administration of intravenous contrast. CONTRAST:  75mL OMNIPAQUE IOHEXOL 300 MG/ML  SOLN COMPARISON:  None. FINDINGS: Pharynx and larynx: Normal. No mass or swelling. Salivary glands: No inflammation, mass, or stone. Thyroid: Thyroid goiter with diffuse enlargement of the thyroid. 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 Lymph nodes: No enlarged lymph nodes. Vascular: Normal vascular enhancement. Limited intracranial: Negative Visualized orbits: Negative Mastoids and visualized paranasal sinuses: Negative Skeleton: Recent extraction of right lower molar. No fracture identified. No other areas of acute dental infection. No  periapical abscess Upper chest: Negative Other: Mild soft tissue edema and swelling below the right mandible. No soft tissue abscess. IMPRESSION: 1. Recent extraction right lower molar. No fracture or periapical abscess. No soft tissue abscess. There is stranding in the soft tissues below to the mandible on the right which may be bruising or due to cellulitis. 2. Thyroid goiter. 2.5 cm right thyroid mass recommend thyroid ultrasound and possible biopsy. Electronically Signed   By: Marlan Palauharles  Clark M.D.   On: 07/16/2019 15:23    Procedures Dental Block  Date/Time: 07/17/2019 12:02 AM Performed by: Anselm PancoastJoy, Maddi Collar C, PA-C Authorized by: Anselm PancoastJoy, Laythan Hayter C, PA-C   Consent:    Consent obtained:  Verbal   Consent given by:  Patient   Risks discussed:  Swelling, unsuccessful block and pain Indications:    Indications: dental pain   Location:    Block type:  Inferior alveolar   Laterality:  Right Procedure details (see MAR for exact dosages):    Topical anesthetic:  Benzocaine gel   Syringe type:  Controlled syringe   Needle gauge:  27 G   Anesthetic injected:  Bupivacaine 0.5% WITH epi   Injection procedure:  Anatomic landmarks identified, anatomic landmarks palpated, introduced needle, negative aspiration for blood and incremental  injection Post-procedure details:    Outcome:  Anesthesia achieved   Patient tolerance of procedure:  Tolerated well, no immediate complications .Nerve Block  Date/Time: 07/17/2019 12:13 AM Performed by: Anselm PancoastJoy, Rubye Strohmeyer C, PA-C Authorized by: Anselm PancoastJoy, Taliana Mersereau C, PA-C   Consent:    Consent obtained:  Verbal   Consent given by:  Patient   Risks discussed:  Swelling, unsuccessful block, pain and bleeding Indications:    Indications:  Pain relief Location:    Body area:  Head   Head nerve blocked: Buccal.   Laterality:  Right Procedure details (see MAR for exact dosages):    Block needle gauge:  27 G   Anesthetic injected:  Bupivacaine 0.5% WITH epi   Injection procedure:  Anatomic landmarks identified, anatomic landmarks palpated, incremental injection, introduced needle and negative aspiration for blood Post-procedure details:    Outcome:  Anesthesia achieved   Patient tolerance of procedure:  Tolerated well, no immediate complications Comments:     Following the inferior alveolar block, patient had adequate anesthesia to the right dentition and gingival surfaces, however, she then was able to isolate additional pain to the right cheek and buccal surfaces.  For this reason, buccal block was also performed.   (including critical care time)  Medications Ordered in ED Medications  oxyCODONE-acetaminophen (PERCOCET/ROXICET) 5-325 MG per tablet 2 tablet (2 tablets Oral Given 07/17/19 0020)  ondansetron (ZOFRAN-ODT) disintegrating tablet 4 mg (4 mg Oral Given 07/17/19 0020)  bupivacaine-epinephrine (MARCAINE W/ EPI) 0.5% -1:200000 injection 1.8 mL (1.8 mLs Infiltration Given 07/17/19 0020)     Initial Impression / Assessment and Plan / ED Course  I have reviewed the triage vital signs and the nursing notes.  Pertinent labs & imaging results that were available during my care of the patient were reviewed by me and considered in my medical decision making (see chart for details).        Patient  presents with uncontrolled right-sided dental pain.  Low suspicion for Ludwig's angina edema or sepsis.  We were able to gain control of the patient's pain using nerve blocks.  She was given an invitation for follow-up with the oral surgeon, Dr. Barbette MerinoJensen, during her ED visit this morning.  She was encouraged to call  to solidify this follow-up. The patient was given instructions for home care as well as return precautions. Patient voices understanding of these instructions, accepts the plan, and is comfortable with discharge.     Final Clinical Impressions(s) / ED Diagnoses   Final diagnoses:  Pain, dental    ED Discharge Orders         Ordered    oxyCODONE-acetaminophen (PERCOCET/ROXICET) 5-325 MG tablet  Every 6 hours PRN     07/17/19 0026    ondansetron (ZOFRAN ODT) 4 MG disintegrating tablet  Every 8 hours PRN     07/17/19 0026    lidocaine (XYLOCAINE) 2 % solution  As needed     07/17/19 0026           Anselm PancoastJoy, Takao Lizer C, PA-C 07/17/19 0149    Geoffery Lyonselo, [place], MD 07/17/19 [phone]

## 2019-07-16 NOTE — ED Notes (Signed)
C/o Dental pain

## 2019-07-16 NOTE — ED Triage Notes (Signed)
Patient here from home with complaints of right lower dental pain. Reports that she had a tooth removed and has since had numbness pain and swelling to the area. Seen recently for same on today. Given antibiotics and pain medication.

## 2019-07-16 NOTE — ED Triage Notes (Addendum)
Pt reports she had tooth extraction  #31 on 8/1-c/o swelling, pain and difficulty swallowing-was seen by another dentist today and sent to ED-NAD-steady gait-pt has noted swelling to right anterior neck that she states is her thyroid/baseline

## 2019-07-16 NOTE — Discharge Instructions (Signed)
You were given a prescription for antibiotics. Please take the antibiotic prescription fully.   Please follow-up with Dr. Hoyt Koch in the office tomorrow.  Please call the office to schedule an appointment.   Please return to the emergency department for any new or worsening symptoms.

## 2019-07-17 MED ORDER — LIDOCAINE VISCOUS HCL 2 % MT SOLN: 15.0000 mL | OROMUCOSAL | 2 refills | Status: DC | PRN

## 2019-07-17 MED ORDER — OXYCODONE-ACETAMINOPHEN 5-325 MG PO TABS: 1.0000 | ORAL_TABLET | Freq: Four times a day (QID) | ORAL | 0 refills | Status: DC | PRN

## 2019-07-17 MED ORDER — ONDANSETRON 4 MG PO TBDP: 4.0000 mg | ORAL_TABLET | Freq: Three times a day (TID) | ORAL | 0 refills | Status: DC | PRN

## 2019-07-17 MED ORDER — OXYCODONE-ACETAMINOPHEN 5-325 MG PO TABS
2.0000 | ORAL_TABLET | Freq: Once | ORAL | Status: AC
  Administered 2019-07-17: 2 via ORAL
  Filled 2019-07-17: qty 2

## 2019-07-17 MED ORDER — BUPIVACAINE-EPINEPHRINE (PF) 0.5% -1:200000 IJ SOLN
1.8000 mL | Freq: Once | INTRAMUSCULAR | Status: AC
  Administered 2019-07-17: 1.8 mL
  Filled 2019-07-17: qty 1.8

## 2019-07-17 MED ORDER — ONDANSETRON 4 MG PO TBDP
4.0000 mg | ORAL_TABLET | Freq: Once | ORAL | Status: AC
  Administered 2019-07-17: 4 mg via ORAL
  Filled 2019-07-17: qty 1

## 2019-07-17 NOTE — Discharge Instructions (Signed)
°  Dental Pain Use the viscous lidocaine for mouth pain. Swish with the lidocaine and spit it out. Do not swallow it. You should also swish with a homemade salt water solution, twice a day.  Make this solution by mixing 8 ounces of warm water with about half a teaspoon of salt. Antiinflammatory medications: Take 600 mg of ibuprofen every 6 hours or 440 mg (over the counter dose) to 500 mg (prescription dose) of naproxen every 12 hours for the next 3 days. After this time, these medications may be used as needed for pain. Take these medications with food to avoid upset stomach. Choose only one of these medications, do not take them together. Acetaminophen (generic for Tylenol): Should you continue to have additional pain while taking the ibuprofen or naproxen, you may add in acetaminophen as needed. Your daily total maximum amount of acetaminophen from all sources should be limited to 4000mg /day for persons without liver problems, or 2000mg /day for those with liver problems. Percocet: May take Percocet (oxycodone-acetaminophen) as needed for severe pain.   Do not drive or perform other dangerous activities while taking this medication as it can cause drowsiness as well as changes in reaction time and judgement.  Please note that each pill of Percocet contains 325 mg of acetaminophen (generic for Tylenol) and the above dosage limits apply.  Nausea/vomiting: Use the ondansetron (generic for Zofran) for nausea or vomiting.  This medication may not prevent all vomiting or nausea, but can help facilitate better hydration. Things that can help with nausea/vomiting also include peppermint/menthol candies, vitamin B12, and ginger.  Please take all of your antibiotics until finished!   You may develop abdominal discomfort or diarrhea from the antibiotic.  You may help offset this with probiotics which you can buy or get in yogurt. Do not eat or take the probiotics until 2 hours after your antibiotic.   For  prescription assistance, may try using prescription discount sites or apps, such as goodrx.com

## 2019-07-21 LAB — CULTURE, BLOOD (ROUTINE X 2)
Culture: NO GROWTH
Culture: NO GROWTH
Special Requests: ADEQUATE
Special Requests: ADEQUATE

## 2019-10-01 ENCOUNTER — Other Ambulatory Visit: Payer: Self-pay | Admitting: Family Medicine

## 2019-10-01 DIAGNOSIS — Z13220 Encounter for screening for lipoid disorders: Secondary | ICD-10-CM | POA: Diagnosis not present

## 2019-10-01 DIAGNOSIS — E049 Nontoxic goiter, unspecified: Secondary | ICD-10-CM

## 2019-10-01 DIAGNOSIS — Z0000 Encounter for general adult medical examination without abnormal findings: Secondary | ICD-10-CM | POA: Diagnosis not present

## 2019-10-01 DIAGNOSIS — E041 Nontoxic single thyroid nodule: Secondary | ICD-10-CM | POA: Diagnosis not present

## 2019-10-08 ENCOUNTER — Ambulatory Visit
Admission: RE | Admit: 2019-10-08 | Discharge: 2019-10-08 | Disposition: A | Discharge status: Home / Self Care | Payer: BC Managed Care – PPO | Source: Ambulatory Visit | Attending: Family Medicine | Admitting: Family Medicine

## 2019-10-08 DIAGNOSIS — E049 Nontoxic goiter, unspecified: Secondary | ICD-10-CM

## 2019-10-08 DIAGNOSIS — E041 Nontoxic single thyroid nodule: Secondary | ICD-10-CM | POA: Diagnosis not present

## 2019-10-16 ENCOUNTER — Other Ambulatory Visit: Payer: Self-pay | Admitting: Family Medicine

## 2019-10-16 DIAGNOSIS — Z1231 Encounter for screening mammogram for malignant neoplasm of breast: Secondary | ICD-10-CM | POA: Diagnosis not present

## 2019-10-16 DIAGNOSIS — E041 Nontoxic single thyroid nodule: Secondary | ICD-10-CM

## 2019-10-30 ENCOUNTER — Ambulatory Visit
Admission: RE | Admit: 2019-10-30 | Discharge: 2019-10-30 | Disposition: A | Discharge status: Home / Self Care | Payer: BC Managed Care – PPO | Source: Ambulatory Visit | Attending: Family Medicine | Admitting: Family Medicine

## 2019-10-30 ENCOUNTER — Other Ambulatory Visit (HOSPITAL_COMMUNITY)
Admission: RE | Admit: 2019-10-30 | Discharge: 2019-10-30 | Disposition: A | Discharge status: Home / Self Care | Payer: BC Managed Care – PPO | Source: Ambulatory Visit | Attending: Family Medicine | Admitting: Family Medicine

## 2019-10-30 DIAGNOSIS — E041 Nontoxic single thyroid nodule: Secondary | ICD-10-CM | POA: Insufficient documentation

## 2019-10-30 DIAGNOSIS — E0789 Other specified disorders of thyroid: Secondary | ICD-10-CM | POA: Diagnosis not present

## 2019-10-31 LAB — CYTOLOGY - NON PAP

## 2019-12-20 ENCOUNTER — Other Ambulatory Visit: Payer: Self-pay | Admitting: Family Medicine

## 2019-12-20 DIAGNOSIS — R109 Unspecified abdominal pain: Secondary | ICD-10-CM

## 2019-12-21 ENCOUNTER — Ambulatory Visit (INDEPENDENT_AMBULATORY_CARE_PROVIDER_SITE_OTHER): Payer: BC Managed Care – PPO

## 2019-12-21 ENCOUNTER — Other Ambulatory Visit: Payer: Self-pay | Admitting: Family Medicine

## 2019-12-21 ENCOUNTER — Other Ambulatory Visit: Payer: Self-pay

## 2019-12-21 DIAGNOSIS — R109 Unspecified abdominal pain: Secondary | ICD-10-CM | POA: Diagnosis not present

## 2019-12-21 MED ORDER — IOHEXOL 300 MG/ML  SOLN
100.0000 mL | Freq: Once | INTRAMUSCULAR | Status: AC | PRN
  Administered 2019-12-21: 10:00:00 100 mL via INTRAVENOUS

## 2019-12-24 ENCOUNTER — Other Ambulatory Visit: Payer: Self-pay

## 2019-12-24 ENCOUNTER — Other Ambulatory Visit: Payer: Self-pay | Admitting: Family Medicine

## 2019-12-24 ENCOUNTER — Ambulatory Visit (INDEPENDENT_AMBULATORY_CARE_PROVIDER_SITE_OTHER): Payer: BC Managed Care – PPO

## 2019-12-24 DIAGNOSIS — R102 Pelvic and perineal pain: Secondary | ICD-10-CM

## 2019-12-24 DIAGNOSIS — R9389 Abnormal findings on diagnostic imaging of other specified body structures: Secondary | ICD-10-CM

## 2019-12-24 DIAGNOSIS — N83292 Other ovarian cyst, left side: Secondary | ICD-10-CM | POA: Diagnosis not present

## 2019-12-28 DIAGNOSIS — Z03818 Encounter for observation for suspected exposure to other biological agents ruled out: Secondary | ICD-10-CM | POA: Diagnosis not present

## 2020-01-03 DIAGNOSIS — Z03818 Encounter for observation for suspected exposure to other biological agents ruled out: Secondary | ICD-10-CM | POA: Diagnosis not present

## 2020-01-10 DIAGNOSIS — Z03818 Encounter for observation for suspected exposure to other biological agents ruled out: Secondary | ICD-10-CM | POA: Diagnosis not present

## 2020-06-23 DIAGNOSIS — H6980 Other specified disorders of Eustachian tube, unspecified ear: Secondary | ICD-10-CM | POA: Diagnosis not present

## 2020-06-23 DIAGNOSIS — R42 Dizziness and giddiness: Secondary | ICD-10-CM | POA: Diagnosis not present

## 2020-08-12 IMAGING — US US FNA BIOPSY THYROID 1ST LESION
1 series · 12 of 12 positions shown · non-contrast
Comparison: US Thyroid 10/08/19 and

INDICATION: Indeterminate thyroid nodule

Right thyroid nodule
2.6 cm
EXAM:
ULTRASOUND GUIDED FINE NEEDLE ASPIRATION OF INDETERMINATE THYROID
NODULE
TECHNIQUE: Informed written consent was obtained from the patient after a
discussion of the risks, benefits and alternatives to treatment.
Questions regarding the procedure were encouraged and answered. A
timeout was performed prior to the initiation of the procedure.

[Series 1: us fna biopsy thyroid 1st lesion · 0.06mm/px · 12 acquisitions, 12 frames shown]
[im 1/12]
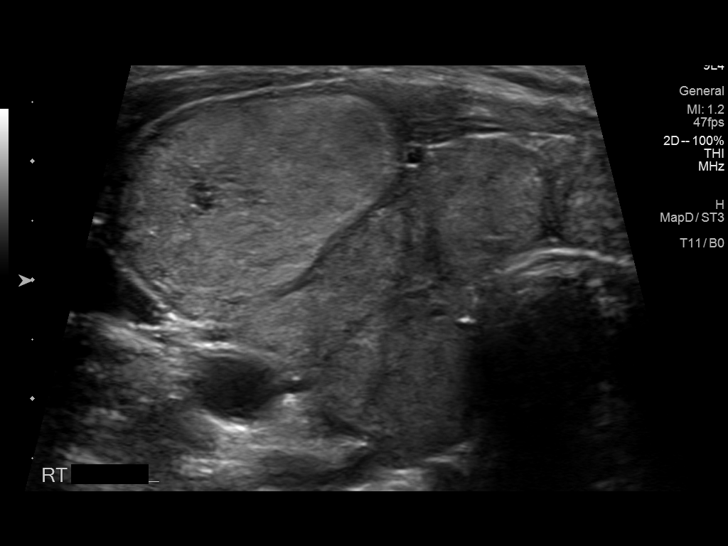
[im 2/12]
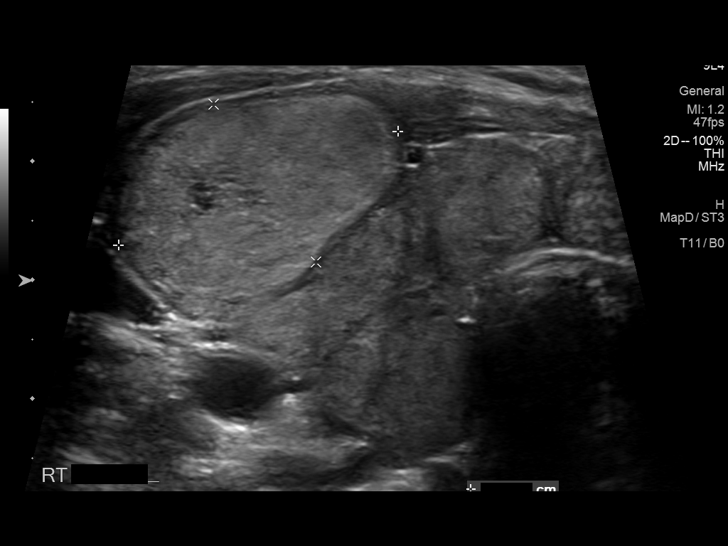
[im 3/12]
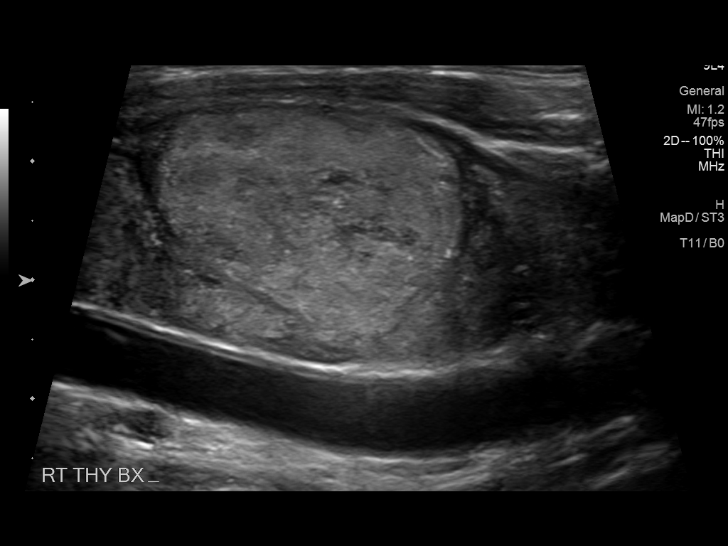
[im 4/12]
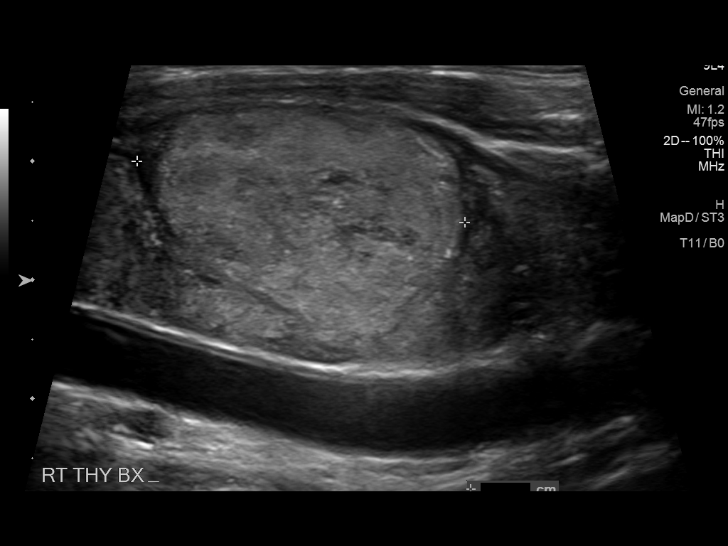
[im 5/12]
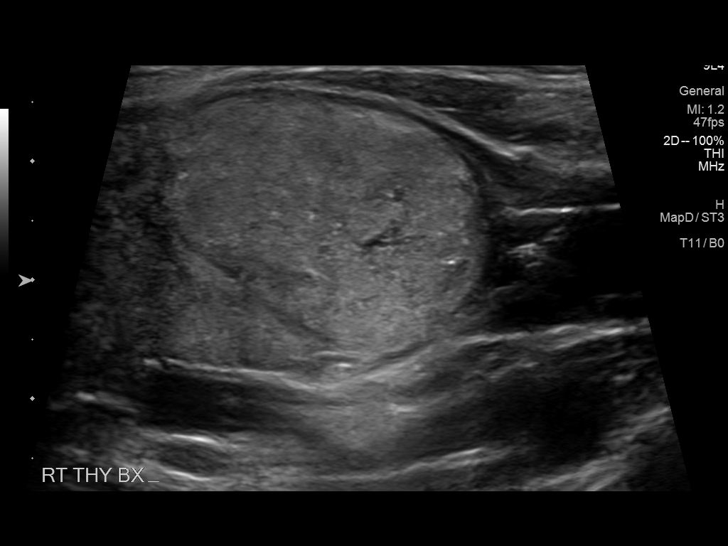
[im 6/12]
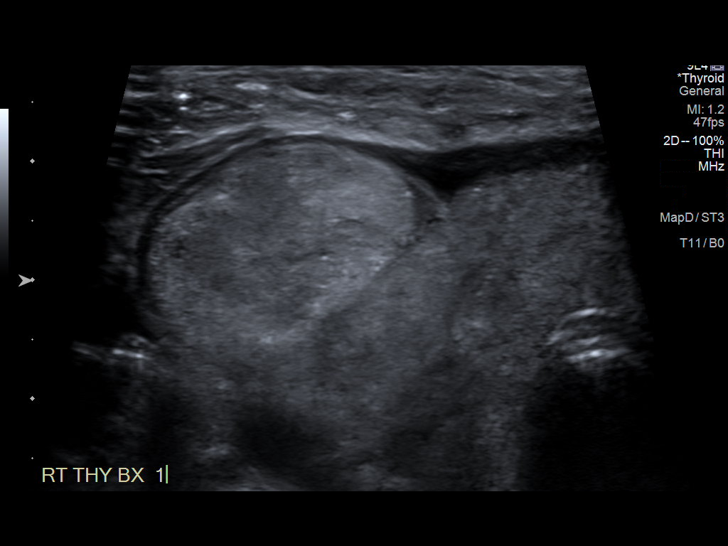
[im 7/12]
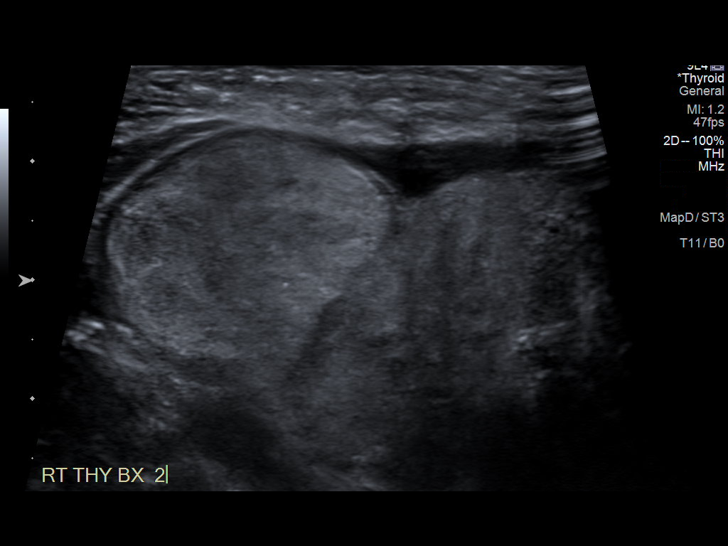
[im 8/12]
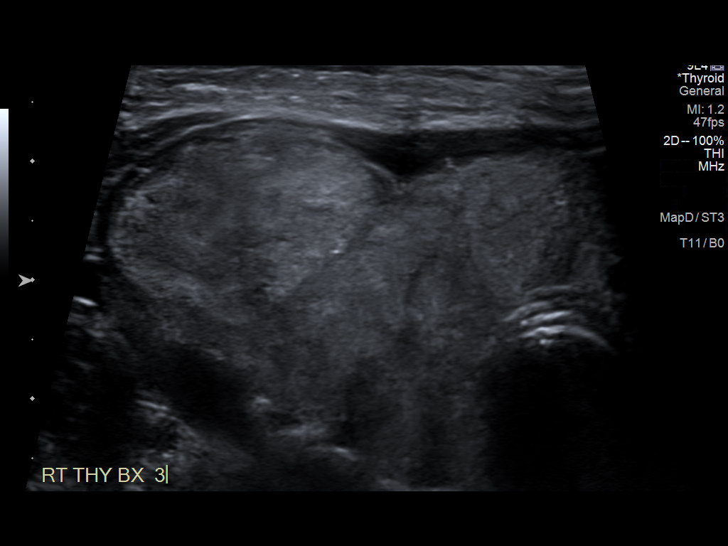
[im 9/12]
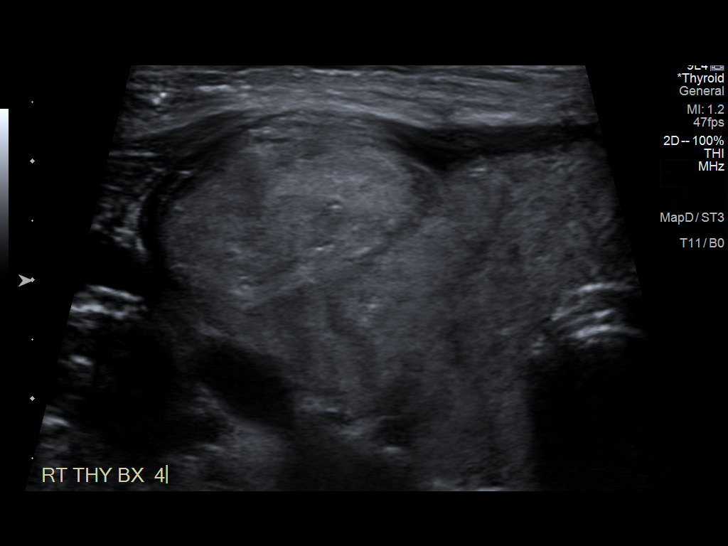
[im 10/12]
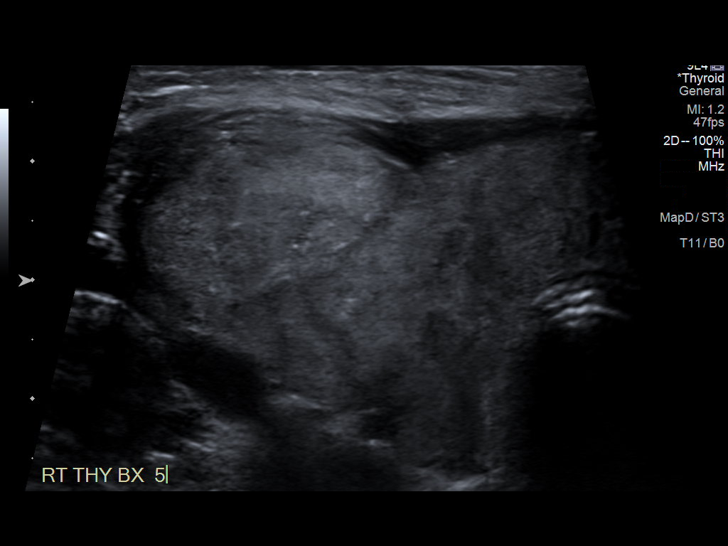
[im 11/12]
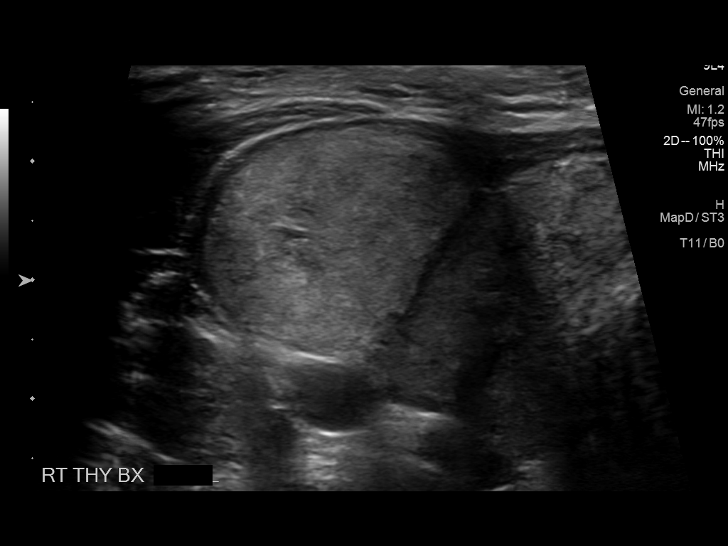
[im 12/12]
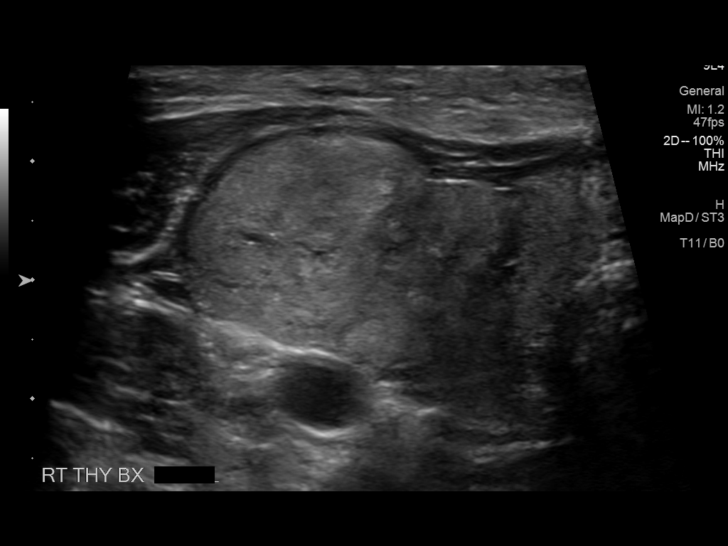

[12 of 12 positions shown; findings below may reference images not displayed]

Biopsy same nodule 02/12/16:

Diagnosis

THYROID, FINE NEEDLE ASPIRATION, RMP TO RLP (SPECIMEN 1 OF 1,
COLLECTED ON 02/12/16):

CONSISTENT WITH BENIGN FOLLICULAR NODULE ([REDACTED] CATEGORY II).

MEDICATIONS:
8 cc 1% lidocaine

COMPLICATIONS:
None immediate.
Pre-procedural ultrasound scanning demonstrated enlarging right
thyroid nodule

The procedure was planned. The neck was prepped in the usual sterile
fashion, and a sterile drape was applied covering the operative
field. A timeout was performed prior to the initiation of the
procedure. Local anesthesia was provided with 1% lidocaine.

Under direct ultrasound guidance, 5 FNA biopsies were performed of
the right thyroid nodule with a 25 gauge needle.

2 of the samples were obtained for AFIRMA as per ordering EDNA

The samples were prepared and submitted to pathology.

Limited post procedural scanning was negative for hematoma or
additional complication. Dressings were placed. The patient
tolerated the above procedures procedure well without immediate
postprocedural complication.
IMPRESSION: Technically successful ultrasound guided fine needle aspiration of
right thyroid nodule

Read by

Hajdinn Neumeister

## 2020-10-03 IMAGING — CT CT ABD-PELV W/ CM
2 of 5 series · 15 of 46 positions shown, 17 images · IV contrast (APPLIED)
Comparison: None.

CLINICAL DATA: Lower abdominal pain with nausea

EXAM:
CT ABDOMEN AND PELVIS WITH CONTRAST
TECHNIQUE: Multidetector CT imaging of the abdomen and pelvis was performed
using the standard protocol following bolus administration of
intravenous contrast. Oral contrast was also administered.
CONTRAST:  100mL OMNIPAQUE IOHEXOL 300 MG/ML  SOLN

[Series 2: axial st · axial · 0.85mm/px · z∈[-514,-74]mm · 12 of 98 slices shown, 14 images]
[im 5/98  soft-tissue]
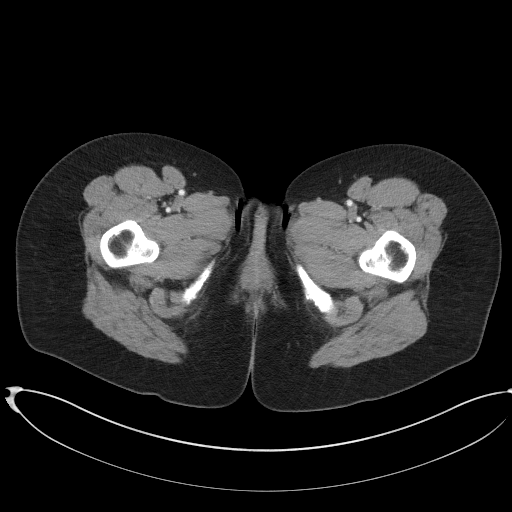
[im 5/98  bone]
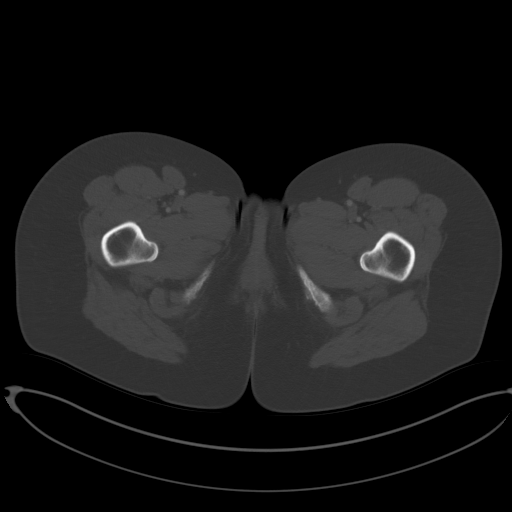
[im 15/98  soft-tissue]
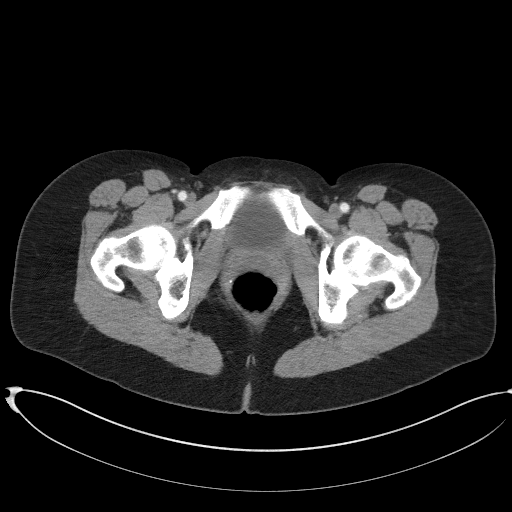
[im 20/98  soft-tissue]
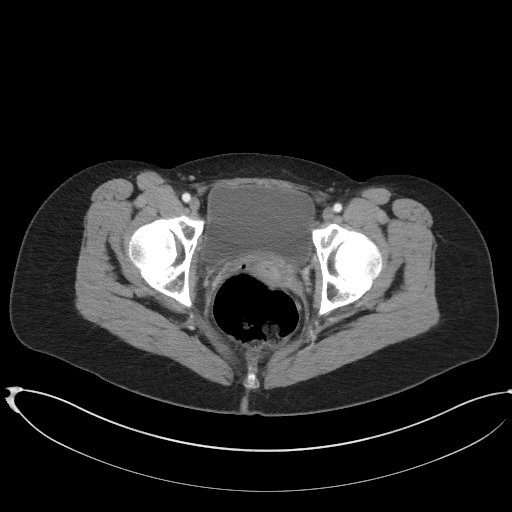
[im 30/98  soft-tissue]
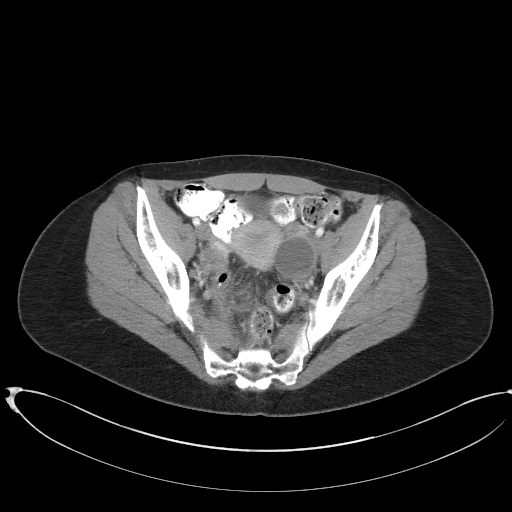
[im 39/98  soft-tissue]
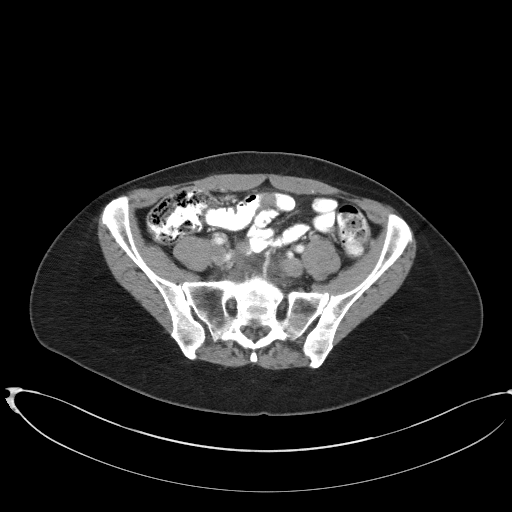
[im 44/98  soft-tissue]
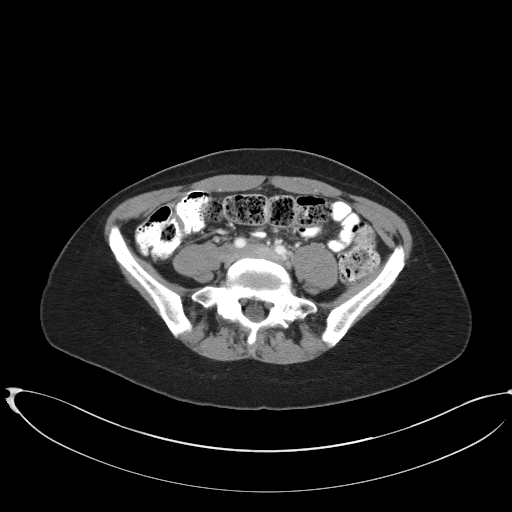
[im 54/98  soft-tissue]
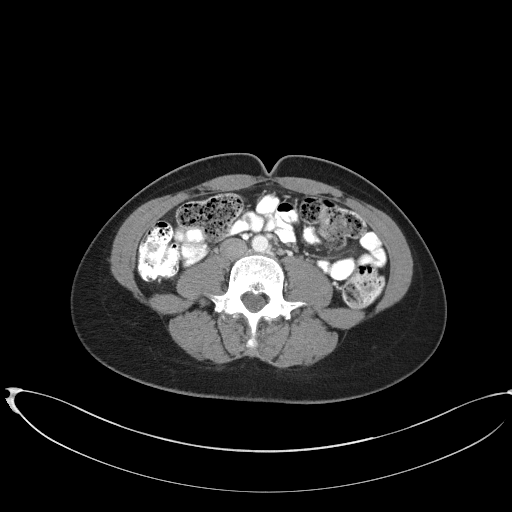
[im 59/98  soft-tissue]
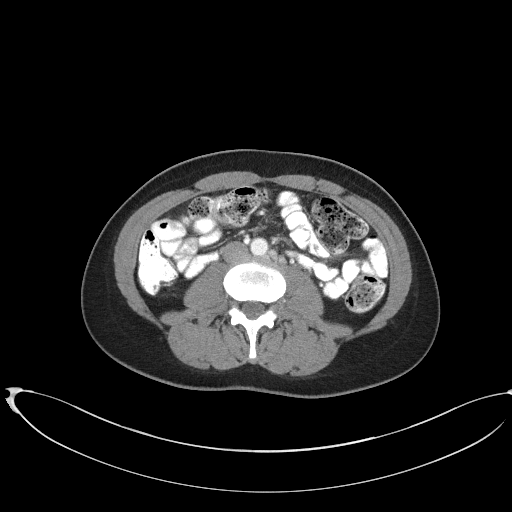
[im 68/98  soft-tissue]
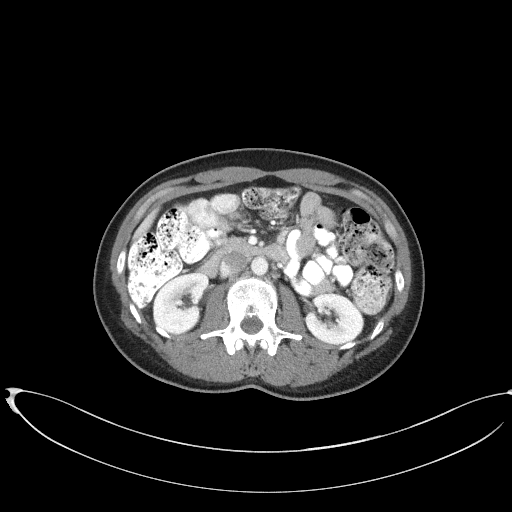
[im 68/98  bone]
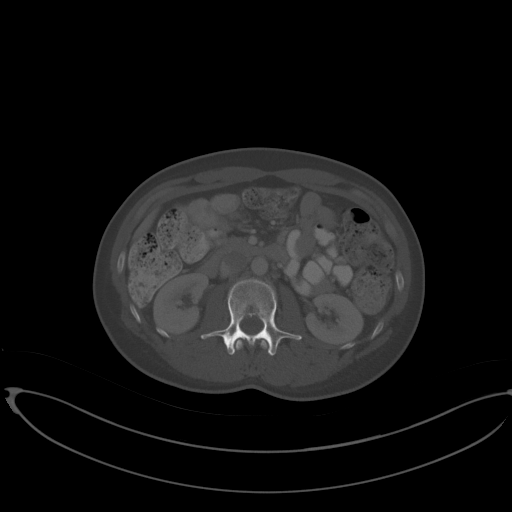
[im 78/98  soft-tissue]
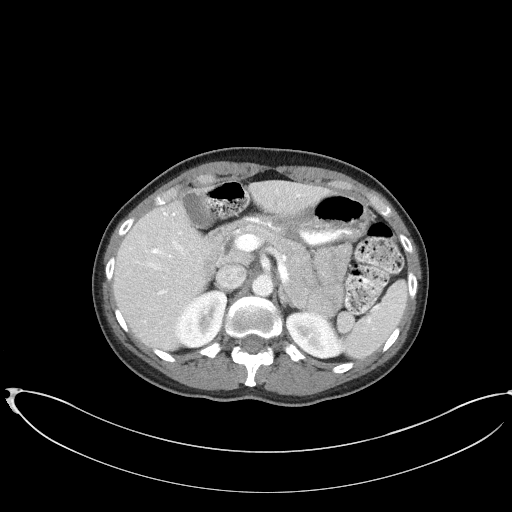
[im 83/98  soft-tissue]
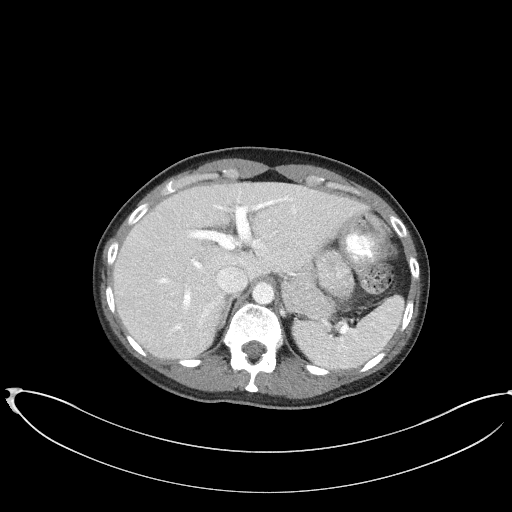
[im 93/98  soft-tissue]
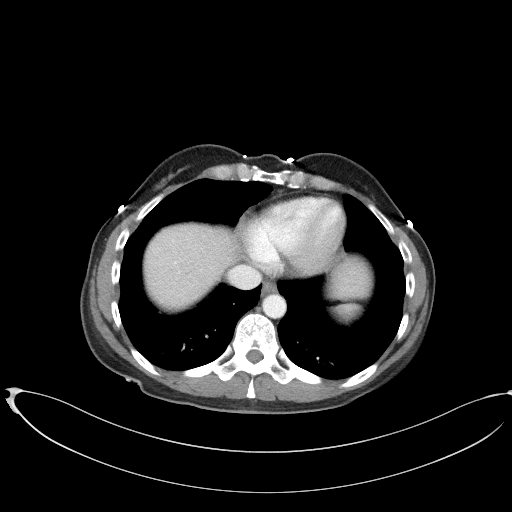

[Series 5: coronal st · coronal · 0.74mm/px · 3 of 77 slices shown]
[im 26/77  soft-tissue]
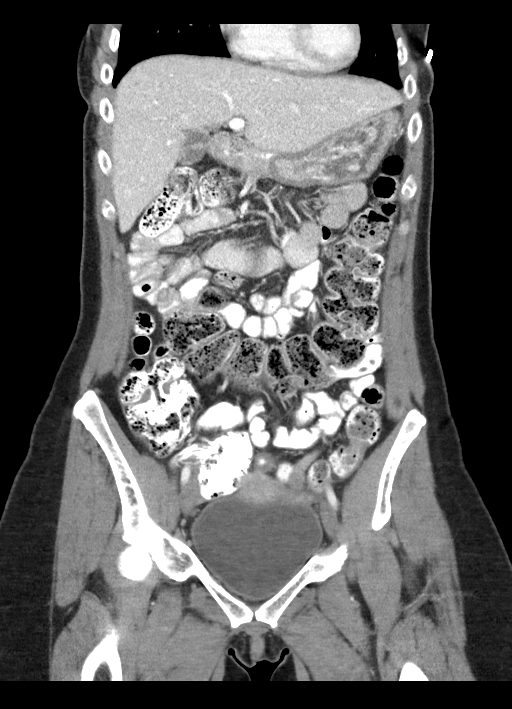
[im 34/77  soft-tissue]
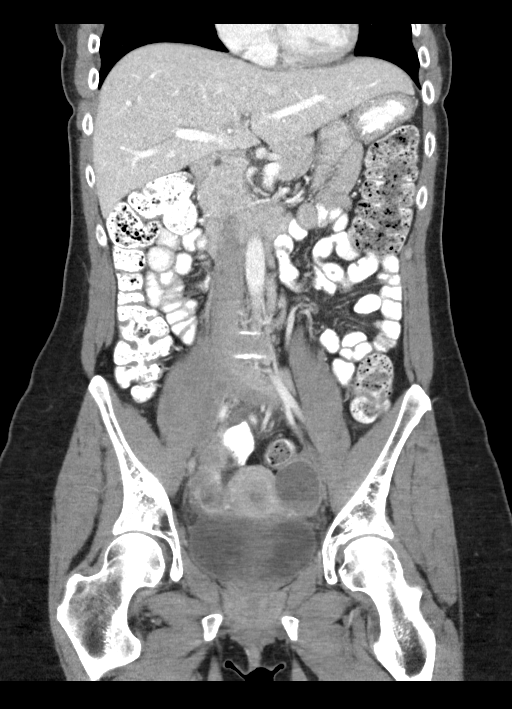
[im 43/77  soft-tissue]
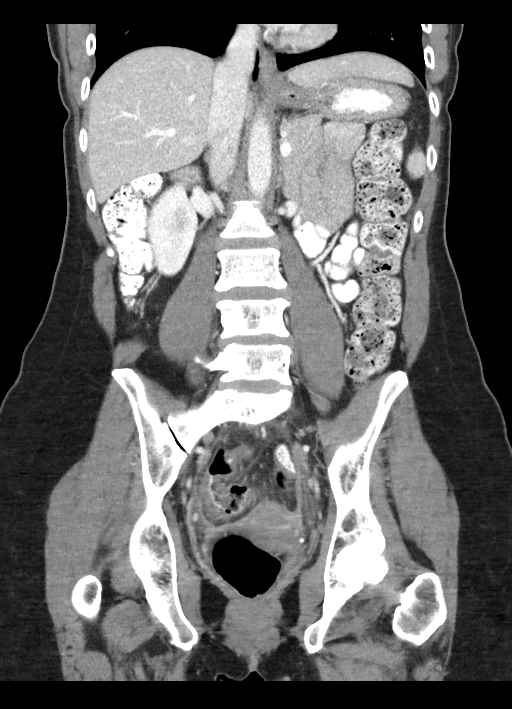

[15 of 46 positions shown; findings below may reference images not displayed]

FINDINGS: Lower chest: Lung bases are clear.

Hepatobiliary: No focal liver lesions are appreciable. Gallbladder
wall is not thickened. There is no biliary duct dilatation.

Pancreas: There is no pancreatic mass or inflammatory focus.

Spleen: No splenic lesions are evident.

Adrenals/Urinary Tract: Adrenals bilaterally appear unremarkable.
Kidneys bilaterally show no evident mass or hydronephrosis on either
side. There is no evident or ureteral calculus on either side.
Urinary bladder is midline with wall thickness within normal limits.

Stomach/Bowel: Rectum is distended with stool and air. There is no
rectal wall thickening or perirectal soft tissue stranding. There is
fairly diffuse stool throughout the colon. There is no appreciable
bowel wall or mesenteric thickening. There is no evident bowel
obstruction. Terminal ileum appears normal. There is mild lipomatous
infiltration of the ileocecal valve. There is no evident free air or
portal venous air.

Vascular/Lymphatic: There is no abdominal aortic aneurysm. No
vascular lesions are evident. There is no appreciable adenopathy in
the abdomen or pelvis.

Reproductive: The uterus is anteverted. There is a 3.8 x 3.3 cm
cystic area arising in the left adnexa which has attenuation values
slightly higher than is expected with a simple cyst. No other pelvic
masses are evident. There is a small amount of free fluid in the
dependent portion of the pelvis.

Other: There is no periappendiceal region inflammation. There is no
abscess in the abdomen pelvis. There is no ascites beyond mild fluid
in the dependent portion of pelvis.

Musculoskeletal: There are no blastic or lytic bone lesions. There
is no intramuscular or abdominal wall lesion.
IMPRESSION: 1. Cystic area in the left adnexa measuring 3.8 x 3.3 cm which has
attenuation values slightly higher than is expected with a simple
cyst. Question hemorrhagic cyst. This finding may warrant
correlation with pelvic ultrasound to further evaluate. Doppler
assessment of the ovaries at that time would also be a reasonable
consideration given increased risk of potential torsion given a mass
of this size arising from an ovary.

2. No evident bowel obstruction. No abscess in the abdomen or
pelvis. No periappendiceal region inflammatory change. Diffuse stool
noted throughout colon.

3. No renal or ureteral calculus. No hydronephrosis. Urinary bladder
wall thickness normal.

4. Slight fluid in the dependent portion of the pelvis. Question
recent ovarian cyst leakage.

These results will be called to the ordering clinician or
representative by the Radiologist Assistant, and communication
documented in the PACS or zVision Dashboard.

## 2020-10-10 DIAGNOSIS — Z0000 Encounter for general adult medical examination without abnormal findings: Secondary | ICD-10-CM | POA: Diagnosis not present

## 2020-10-10 DIAGNOSIS — E041 Nontoxic single thyroid nodule: Secondary | ICD-10-CM | POA: Diagnosis not present

## 2020-10-10 DIAGNOSIS — Z13220 Encounter for screening for lipoid disorders: Secondary | ICD-10-CM | POA: Diagnosis not present

## 2020-10-10 DIAGNOSIS — Z131 Encounter for screening for diabetes mellitus: Secondary | ICD-10-CM | POA: Diagnosis not present

## 2020-11-26 DIAGNOSIS — Z03818 Encounter for observation for suspected exposure to other biological agents ruled out: Secondary | ICD-10-CM | POA: Diagnosis not present

## 2020-12-26 DIAGNOSIS — M25571 Pain in right ankle and joints of right foot: Secondary | ICD-10-CM | POA: Diagnosis not present

## 2021-10-26 DIAGNOSIS — Z13220 Encounter for screening for lipoid disorders: Secondary | ICD-10-CM | POA: Diagnosis not present

## 2021-10-26 DIAGNOSIS — E041 Nontoxic single thyroid nodule: Secondary | ICD-10-CM | POA: Diagnosis not present

## 2021-10-26 DIAGNOSIS — Z0000 Encounter for general adult medical examination without abnormal findings: Secondary | ICD-10-CM | POA: Diagnosis not present

## 2021-10-26 DIAGNOSIS — Z131 Encounter for screening for diabetes mellitus: Secondary | ICD-10-CM | POA: Diagnosis not present

## 2021-12-09 DIAGNOSIS — Z1211 Encounter for screening for malignant neoplasm of colon: Secondary | ICD-10-CM | POA: Diagnosis not present

## 2022-01-04 DIAGNOSIS — Z01419 Encounter for gynecological examination (general) (routine) without abnormal findings: Secondary | ICD-10-CM | POA: Diagnosis not present

## 2022-01-04 DIAGNOSIS — Z1231 Encounter for screening mammogram for malignant neoplasm of breast: Secondary | ICD-10-CM | POA: Diagnosis not present

## 2022-01-04 DIAGNOSIS — Z124 Encounter for screening for malignant neoplasm of cervix: Secondary | ICD-10-CM | POA: Diagnosis not present

## 2022-01-04 DIAGNOSIS — Z6825 Body mass index (BMI) 25.0-25.9, adult: Secondary | ICD-10-CM | POA: Diagnosis not present

## 2022-03-24 DIAGNOSIS — Z1231 Encounter for screening mammogram for malignant neoplasm of breast: Secondary | ICD-10-CM | POA: Diagnosis not present

## 2022-07-31 ENCOUNTER — Emergency Department (HOSPITAL_BASED_OUTPATIENT_CLINIC_OR_DEPARTMENT_OTHER): Payer: BC Managed Care – PPO

## 2022-07-31 ENCOUNTER — Other Ambulatory Visit: Payer: Self-pay

## 2022-07-31 ENCOUNTER — Emergency Department (HOSPITAL_BASED_OUTPATIENT_CLINIC_OR_DEPARTMENT_OTHER)
Admission: EM | Admit: 2022-07-31 | Discharge: 2022-07-31 | Disposition: A | Discharge status: Home / Self Care | Payer: BC Managed Care – PPO | Attending: Emergency Medicine | Admitting: Emergency Medicine

## 2022-07-31 ENCOUNTER — Encounter (HOSPITAL_BASED_OUTPATIENT_CLINIC_OR_DEPARTMENT_OTHER): Payer: Self-pay | Admitting: Emergency Medicine

## 2022-07-31 DIAGNOSIS — R9431 Abnormal electrocardiogram [ECG] [EKG]: Secondary | ICD-10-CM | POA: Diagnosis not present

## 2022-07-31 DIAGNOSIS — S52532A Colles' fracture of left radius, initial encounter for closed fracture: Secondary | ICD-10-CM | POA: Diagnosis not present

## 2022-07-31 DIAGNOSIS — R609 Edema, unspecified: Secondary | ICD-10-CM | POA: Diagnosis not present

## 2022-07-31 DIAGNOSIS — S6992XA Unspecified injury of left wrist, hand and finger(s), initial encounter: Secondary | ICD-10-CM | POA: Diagnosis not present

## 2022-07-31 DIAGNOSIS — S52592A Other fractures of lower end of left radius, initial encounter for closed fracture: Secondary | ICD-10-CM | POA: Diagnosis not present

## 2022-07-31 DIAGNOSIS — S62102A Fracture of unspecified carpal bone, left wrist, initial encounter for closed fracture: Secondary | ICD-10-CM

## 2022-07-31 DIAGNOSIS — Y9321 Activity, ice skating: Secondary | ICD-10-CM | POA: Insufficient documentation

## 2022-07-31 DIAGNOSIS — S6292XA Unspecified fracture of left wrist and hand, initial encounter for closed fracture: Secondary | ICD-10-CM | POA: Diagnosis not present

## 2022-07-31 DIAGNOSIS — S52502A Unspecified fracture of the lower end of left radius, initial encounter for closed fracture: Secondary | ICD-10-CM | POA: Diagnosis not present

## 2022-07-31 LAB — CBC WITH DIFFERENTIAL/PLATELET
Abs Immature Granulocytes: 0.04 10*3/uL (ref 0.00–0.07)
Basophils Absolute: 0 10*3/uL (ref 0.0–0.1)
Basophils Relative: 0 %
Eosinophils Absolute: 0.1 10*3/uL (ref 0.0–0.5)
Eosinophils Relative: 1 %
HCT: 36.5 % (ref 36.0–46.0)
Hemoglobin: 12.8 g/dL (ref 12.0–15.0)
Immature Granulocytes: 0 %
Lymphocytes Relative: 13 %
Lymphs Abs: 1.2 10*3/uL (ref 0.7–4.0)
MCH: 32.6 pg (ref 26.0–34.0)
MCHC: 35.1 g/dL (ref 30.0–36.0)
MCV: 92.9 fL (ref 80.0–100.0)
Monocytes Absolute: 0.5 10*3/uL (ref 0.1–1.0)
Monocytes Relative: 5 %
Neutro Abs: 7.8 10*3/uL — ABNORMAL HIGH (ref 1.7–7.7)
Neutrophils Relative %: 81 %
Platelets: 240 10*3/uL (ref 150–400)
RBC: 3.93 MIL/uL (ref 3.87–5.11)
RDW: 11.9 % (ref 11.5–15.5)
WBC: 9.6 10*3/uL (ref 4.0–10.5)
nRBC: 0 % (ref 0.0–0.2)

## 2022-07-31 LAB — BASIC METABOLIC PANEL
Anion gap: 8 (ref 5–15)
BUN: 19 mg/dL (ref 6–20)
CO2: 25 mmol/L (ref 22–32)
Calcium: 8.9 mg/dL (ref 8.9–10.3)
Chloride: 104 mmol/L (ref 98–111)
Creatinine, Ser: 0.85 mg/dL (ref 0.44–1.00)
GFR, Estimated: >60 mL/min (ref >60)
Glucose, Bld: 110 mg/dL — ABNORMAL HIGH (ref 70–99)
Potassium: 4.1 mmol/L (ref 3.5–5.1)
Sodium: 137 mmol/L (ref 135–145)

## 2022-07-31 LAB — CBG MONITORING, ED: Glucose-Capillary: 105 mg/dL — ABNORMAL HIGH (ref 70–99)

## 2022-07-31 LAB — HCG, SERUM, QUALITATIVE: Preg, Serum: NEGATIVE

## 2022-07-31 MED ORDER — ONDANSETRON HCL 4 MG/2ML IJ SOLN
4.0000 mg | Freq: Once | INTRAMUSCULAR | Status: AC
  Administered 2022-07-31: 4 mg via INTRAVENOUS
  Filled 2022-07-31: qty 2

## 2022-07-31 MED ORDER — PROPOFOL 10 MG/ML IV BOLUS
1.0000 mg/kg | Freq: Once | INTRAVENOUS | Status: AC
  Administered 2022-07-31: 50 mg via INTRAVENOUS
  Filled 2022-07-31: qty 20

## 2022-07-31 MED ORDER — SODIUM CHLORIDE 0.9 % IV BOLUS
1000.0000 mL | Freq: Once | INTRAVENOUS | Status: AC
  Administered 2022-07-31: 1000 mL via INTRAVENOUS

## 2022-07-31 MED ORDER — LIDOCAINE HCL (PF) 1 % IJ SOLN
30.0000 mL | Freq: Once | INTRAMUSCULAR | Status: DC
Start: 2022-07-31 — End: 2022-07-31

## 2022-07-31 MED ORDER — HYDROCODONE-ACETAMINOPHEN 5-325 MG PO TABS: 1.0000 | ORAL_TABLET | Freq: Four times a day (QID) | ORAL | 0 refills | Status: DC | PRN

## 2022-07-31 MED ORDER — HYDROMORPHONE HCL 1 MG/ML IJ SOLN
1.0000 mg | Freq: Once | INTRAMUSCULAR | Status: AC
  Administered 2022-07-31: 1 mg via INTRAVENOUS
  Filled 2022-07-31: qty 1

## 2022-07-31 MED ORDER — HYDROCODONE-ACETAMINOPHEN 5-325 MG PO TABS
1.0000 | ORAL_TABLET | Freq: Four times a day (QID) | ORAL | 0 refills | Status: DC | PRN
  Filled 2022-07-31: qty 10, 3d supply, fill #0

## 2022-07-31 MED ORDER — FENTANYL CITRATE PF 50 MCG/ML IJ SOSY
50.0000 ug | PREFILLED_SYRINGE | Freq: Once | INTRAMUSCULAR | Status: AC
  Administered 2022-07-31: 50 ug via INTRAVENOUS
  Filled 2022-07-31: qty 1

## 2022-07-31 NOTE — ED Triage Notes (Signed)
Pt injured L wrist while roller skating and falling today. Deformity noted to L wrist. Pt splinted by EMS and ice applied

## 2022-07-31 NOTE — ED Notes (Signed)
ED Provider at bedside. 

## 2022-07-31 NOTE — ED Provider Notes (Signed)
Shared visit.  History, physical, medical decision making are my own and shared with PA.  Patient with left wrist fracture after fall while rollerblading.  Has deformity to the left wrist.  Neurovascular neuromuscular intact otherwise.  X-ray per my review and interpretation shows severely comminuted displaced fracture in the distal left radius.  There is dorsal displacement.  Crystal Callahan, [place] has already talked with hand surgeon who is recommending attempt at reduction and splint and have him follow-up outpatient.  Propofol sedation was provided by myself and reduction performed by PA myself.  Medical screening labs were ordered including CBC, CMP and were unremarkable per my review and interpretation.  On review of postreduction x-ray there appears to be interval improvement.  She is tolerating her splint well.  She is recovered from sedation well.  We will have her follow-up with Dr. Frazier Butt outpatient.  We will provide pain medication.  This chart was dictated using voice recognition software.  Despite best efforts to proofread,  errors can occur which can change the documentation meaning.   .Sedation  Date/Time: 07/31/2022 3:57 PM  Performed by: Crystal Norfolk, DO Authorized by: Crystal Norfolk, DO   Consent:    Consent obtained:  Written   Consent given by:  Patient   Risks discussed:  Allergic reaction, inadequate sedation, dysrhythmia, nausea, prolonged hypoxia resulting in organ damage and prolonged sedation necessitating reversal   Alternatives discussed:  Analgesia without sedation Universal protocol:    Procedure explained and questions answered to patient or proxy's satisfaction: yes     Relevant documents present and verified: yes     Test results available: yes     Immediately prior to procedure, a time out was called: yes     Patient identity confirmed:  Arm band, verbally with patient and hospital-assigned identification number Indications:    Procedure performed:  Fracture  reduction   Procedure necessitating sedation performed by:  Physician performing sedation Pre-sedation assessment:    Time since last food or drink:  8 hours   NPO status caution: unable to specify NPO status     ASA classification: class 1 - normal, healthy patient     Mouth opening:  3 or more finger widths   Thyromental distance:  4 finger widths   Mallampati score:  I - soft palate, uvula, fauces, pillars visible   Neck mobility: normal     Pre-sedation assessments completed and reviewed: airway patency, cardiovascular function, hydration status, mental status, nausea/vomiting, pain level, respiratory function and temperature   Immediate pre-procedure details:    Reassessment: Patient reassessed immediately prior to procedure     Reviewed: vital signs     Verified: bag valve mask available, emergency equipment available, intubation equipment available, IV patency confirmed, oxygen available and reversal medications available   Procedure details (see MAR for exact dosages):    Total Provider sedation time (minutes):  25 Post-procedure details:    Post-sedation assessment completed:  07/31/2022 4:19 PM   Attendance: Constant attendance by certified staff until patient recovered     Recovery: Patient returned to pre-procedure baseline     Post-sedation assessments completed and reviewed: airway patency, cardiovascular function, hydration status, mental status, nausea/vomiting, pain level, respiratory function and temperature     Patient is stable for discharge or admission: yes     Procedure completion:  Tolerated well, no immediate complications     Crystal Norfolk, DO 07/31/22 1619

## 2022-07-31 NOTE — Discharge Instructions (Signed)
At this time there does not appear to be the presence of an emergent medical condition, however there is always the potential for conditions to change. Please read and follow the below instructions.  Please return to the Emergency Department immediately for any new or worsening symptoms. Please be sure to follow up with your Primary Care Provider within one week regarding your visit today; please call their office to schedule an appointment even if you are feeling better for a follow-up visit. Please call the hand surgeon Dr. Frazier Butt for further management of your wrist fracture.  You are placed in a splint today, if this becomes too tight or uncomfortable please return to the ER immediately for evaluation.  If your splint gets wet please return to the ER for replacement. You may take the medication Norco (Hydrocodone/Acetaminophen) as prescribed to help with severe pain.  This medication will make you drowsy so do not drive, drink alcohol, take other sedating medications or perform any dangerous activities such as driving after taking Norco. Norco contains Tylenol (acetaminophen) so do not take any other Tylenol-containing products with Norco. Please use rest ice and elevation to help with your pain and swelling.   Please read the additional information packets attached to your discharge summary.  Go to the nearest Emergency Department immediately if: You have fever or chills Your skin or fingers on your injured arm turn blue or gray. Your arm feels cold or gets numb. You have very bad pain in your injured wrist. You have any new/concerning or worsening of symptoms.  Do not take your medicine if  develop an itchy rash, swelling in your mouth or lips, or difficulty breathing; call 911 and seek immediate emergency medical attention if this occurs.  You may review your lab tests and imaging results in their entirety on your MyChart account.  Please discuss all results of fully with your primary  care provider and other specialist at your follow-up visit.  Note: Portions of this text may have been transcribed using voice recognition software. Every effort was made to ensure accuracy; however, inadvertent computerized transcription errors may still be present.

## 2022-07-31 NOTE — ED Provider Notes (Signed)
[facility] EMERGENCY DEPARTMENT Provider Note   CSN: [id number] Arrival date & time: 07/31/22  1304     History  Chief Complaint  Patient presents with   Arm Injury   Dizziness    Crystal Callahan is a 51 y.o. female 51 year old female reports Zolvit is otherwise healthy presented for evaluation of left wrist injury.  Patient was ice-skating earlier today, she fell, FOOSH onto left hand she had immediate pain and swelling of the left wrist, pain was severe constant and does not radiate worsens with movement palpation no alleviating factors.  Patient arrived via EMS today.  She reports she has been feeling okay after the wrist injury and however when they applied ice to the area she became somewhat dizzy, this has subsided prior to my evaluation.  She denies any head injury, loss conscious, blood thinner use, neck pain, back pain, chest pain, abdominal pain, numbness, tingling or any additional injuries or concerns.  HPI     Home Medications Prior to Admission medications   Medication Sig Start Date End Date Taking? Authorizing Provider  HYDROcodone-acetaminophen (NORCO/VICODIN) 5-325 MG tablet Take 1 tablet by mouth every 6 (six) hours as needed for severe pain. 07/31/22  Yes Harlene Salts A, PA-C  Ascorbic Acid (VITAMIN C) 100 MG tablet Take by mouth.    [provider]  b complex vitamins tablet Take by mouth.    [provider]  lidocaine (XYLOCAINE) 2 % solution Use as directed 15 mLs in the mouth or throat as needed for mouth pain. 07/17/19   Joy, Shawn C, PA-C  loratadine (CLARITIN) 10 MG tablet Take by mouth.    [provider]  Multiple Vitamins-Minerals (THERA-M) TABS Take by mouth.    [provider]  ondansetron (ZOFRAN ODT) 4 MG disintegrating tablet Take 1 tablet (4 mg total) by mouth every 8 (eight) hours as needed for nausea or vomiting. 07/17/19   Joy, Hillard Danker, PA-C  venlafaxine XR (EFFEXOR-XR) 75 MG 24 hr capsule  02/23/19    [provider]      Allergies    Demerol [meperidine hcl]    Review of Systems   Review of Systems Ten systems are reviewed and are negative for acute change except as noted in the HPI  Physical Exam Updated Vital Signs BP 128/81   Pulse 89   Temp 97.8 F (36.6 C)   Resp 17   Wt 77.1 kg   SpO2 100%   BMI 25.10 kg/m  Physical Exam Constitutional:      General: She is not in acute distress.    Appearance: Normal appearance. She is well-developed. She is not ill-appearing or diaphoretic.  HENT:     Head: Normocephalic and atraumatic. No raccoon eyes or Battle's sign.     Jaw: There is normal jaw occlusion.     Right Ear: No hemotympanum.     Left Ear: No hemotympanum.     Nose: Nose normal.     Mouth/Throat:     Mouth: Mucous membranes are moist.     Pharynx: Oropharynx is clear.     Comments: No evidence for acute dental injury Eyes:     General: Vision grossly intact. Gaze aligned appropriately.     Pupils: Pupils are equal, round, and reactive to light.  Neck:     Trachea: Trachea and phonation normal.  Pulmonary:     Effort: Pulmonary effort is normal. No respiratory distress.  Abdominal:     General: There is  no distension.     Palpations: Abdomen is soft.     Tenderness: There is no abdominal tenderness. There is no guarding or rebound.  Musculoskeletal:        General: Normal range of motion.     Cervical back: Normal range of motion.     Comments: I removed patient's wedding rings with surgical jelly and given the patient's husband after x-ray.  Patient left wrist with deformity suggestive of Colles'.  Patient is tender to palpation along the deformity there are no skin breaks or tenting.  Capillary refill and sensation tact to all fingers.  Motion intact to the fingers.  ROM exam of left wrist deferred due to obvious deformity.  Patient demonstrates good flexion extension of the elbow without pain.  No pain with palpation of the shoulder, elbow or  fingers.  Radial pulse intact.  Compartments soft. --- No midline spinal tenderness palpation.  No crepitus step-off deformity spine.  No tenderness to the neck back chest abdomen.  Patient demonstrates full ROM with appropriate motion without pain of the bilateral lower extremities and right upper extremity.  Skin:    General: Skin is warm and dry.  Neurological:     Mental Status: She is alert.     GCS: GCS eye subscore is 4. GCS verbal subscore is 5. GCS motor subscore is 6.     Comments: Speech is clear and goal oriented, follows commands Major Cranial nerves without deficit, no facial droop Moves extremities without ataxia, coordination intact  Psychiatric:        Behavior: Behavior normal.     ED Results / Procedures / Treatments   Labs (all labs ordered are listed, but only abnormal results are displayed) Labs Reviewed  CBC WITH DIFFERENTIAL/PLATELET - Abnormal; Notable for the following components:      Result Value   Neutro Abs 7.8 (*)    All other components within normal limits  BASIC METABOLIC PANEL - Abnormal; Notable for the following components:   Glucose, Bld 110 (*)    All other components within normal limits  CBG MONITORING, ED - Abnormal; Notable for the following components:   Glucose-Capillary 105 (*)    All other components within normal limits  HCG, SERUM, QUALITATIVE    EKG None  Radiology DG Wrist 2 Views Left  Result Date: 07/31/2022 CLINICAL DATA:  Post reduction left wrist radiographs. EXAM: LEFT WRIST - 2 VIEW COMPARISON:  07/31/2022 at 1:45 p.m. FINDINGS: There has been significant partial reduction of the displaced and dorsally angulated fracture of the distal radius. Fracture impaction has been notably reduced. There has been a significant reduction in displacement, with approximately 4 mm of dorsal displacement persisting. Improved angulation of the distal radial articular surface now measuring 11 degrees. Wrist in case to in a fiberglass cast.  Wrist joints remain normally aligned. IMPRESSION: Significant improvement in the distal left radius fracture alignment following closed reduction. Electronically Signed   By: Amie Portland M.D.   On: 07/31/2022 16:25   DG Wrist Complete Left  Result Date: 07/31/2022 CLINICAL DATA:  Trauma, fall EXAM: LEFT WRIST - COMPLETE 3+ VIEW COMPARISON:  None Available. FINDINGS: Severely comminuted fracture is seen in the distal end of left radius. Fracture lines appear to extend to the articular surface. There is dorsal displacement of distal major fracture fragments along with the carpals in relation to the shaft of radius. Rest of the visualized bony structures appear intact. IMPRESSION: Severely comminuted displaced fracture is seen in the  distal left radius. There is dorsal displacement of distal fracture fragments along with the carpals in relation to the shaft of radius. Electronically Signed   By: Ernie Avena M.D.   On: 07/31/2022 14:02    Procedures .Ortho Injury Treatment  Date/Time: 07/31/2022 4:03 PM  Performed by: Bill Salinas, PA-C Authorized by: Bill Salinas, PA-C   Consent:    Consent obtained:  Verbal   Consent given by:  Patient and spouse   Risks discussed:  Fracture, irreducible dislocation, recurrent dislocation, nerve damage, stiffness, restricted joint movement and vascular damageInjury location: wrist Location details: left wrist Injury type: fracture (Colles' fracture) Pre-procedure neurovascular assessment: neurovascularly intact Pre-procedure distal perfusion: normal Pre-procedure neurological function: normal Pre-procedure range of motion: reduced  Anesthesia: Local anesthesia used: no  Patient sedated: Yes. Refer to sedation procedure documentation for details of sedation. Manipulation performed: yes Skeletal traction used: yes Reduction successful: yes X-ray confirmed reduction: yes Immobilization: splint and sling Splint type: sugar tong Splint  Applied by: ED Provider and ED Tech Supplies used: Ortho-Glass Post-procedure neurovascular assessment: post-procedure neurovascularly intact Post-procedure distal perfusion: normal Post-procedure neurological function: normal Post-procedure range of motion comment: Splinted Comments: Please see Dr. Ladona Mow sedation note.       Medications Ordered in ED Medications  fentaNYL (SUBLIMAZE) injection 50 mcg (50 mcg Intravenous Given 07/31/22 1406)  HYDROmorphone (DILAUDID) injection 1 mg (1 mg Intravenous Given 07/31/22 1452)  ondansetron (ZOFRAN) injection 4 mg (4 mg Intravenous Given 07/31/22 1531)  sodium chloride 0.9 % bolus 1,000 mL (1,000 mLs Intravenous New Bag/Given 07/31/22 1530)  propofol (DIPRIVAN) 10 mg/mL bolus/IV push 77.1 mg (50 mg Intravenous Given 07/31/22 1538)    ED Course/ Medical Decision Making/ A&P Clinical Course as of 07/31/22 1702  Sat Jul 31, 2022  1420 Patient's rings were removed and given to her husband.  Pain controlled. [BM]  1433 Dr. Frazier Butt advises to reduce fracture and placed into a sugar-tong and he can follow-up in his office. [BM]  1645 Spoke with Dr. Frazier Butt, discharge with outpatient follow-up. [BM]    Clinical Course User Index [BM] Elizabeth Palau                           Medical Decision Making 51 year old female presented after [facility] onto left hand while ice skating earlier today.  She has obvious deformity of the left wrist, she is neurovascular intact on arrival.  There is no skin break or tenting.  She denies any injury to head neck back chest abdomen and no evidence of injury on evaluation of those areas.  X-ray left wrist demonstrates a displaced left Colles' fracture.  I consulted hand specialist Dr. Frazier Butt who advised reduction here in the ER and placing patient in a sugar-tong and patient can follow-up with his office for further management.  Screening labs were ordered, CBC BMP and pregnancy test, no significant  abnormalities. --- Patient was then consented for conscious sedation and reduction of left wrist fracture.  Propofol sedation performed by Dr. Lockie Mola.  Dr. Lockie Mola and I then reduced the patient's left wrist fracture.  She was placed into a sugar-tong splint.  Postreduction films were obtained and show improvement of fracture alignment.  Patient was reassessed she is resting comfortably no acute distress vital signs are stable.  I discussed strict ER precautions and splint precautions with patient and her husband.  Amount and/or Complexity of Data Reviewed Labs: ordered. Radiology: ordered. Decision-making details documented in  ED Course.    Details: I personally reviewed and interpreted both patient's initial three-view x-ray of the left wrist which showed the Colles' fracture.  I also reviewed and interpreted patient's postreduction film which showed improvement of fracture alignment.  Risk Prescription drug management. Risk Details: Patient reassessed at discharge, she is resting comfortably no acute distress, husband at bedside.  Patient states understanding of care plan she has no questions or concerns.  Patient was provided with a short prescription of Norco, 10 tablets, without refill.  PMD P reviewed, patient without prior narcotic prescriptions.  I discussed narcotic precautions with the patient and her husband today and they stated understanding.   At this time there does not appear to be any evidence of an acute emergency medical condition and the patient appears stable for discharge with appropriate outpatient follow up. Diagnosis was discussed with patient who verbalizes understanding of care plan and is agreeable to discharge. I have discussed return precautions with patient and husband who verbalizes understanding. Patient encouraged to follow-up with their PCP and specialist. All questions answered.  Was seen and evaluated by Dr. Lockie Mola during this visit who agrees with  management and discharge.  Note: Portions of this report may have been transcribed using voice recognition software. Every effort was made to ensure accuracy; however, inadvertent computerized transcription errors may still be present.         Final Clinical Impression(s) / ED Diagnoses Final diagnoses:  Closed fracture of left wrist, initial encounter    Rx / DC Orders ED Discharge Orders          Ordered    HYDROcodone-acetaminophen (NORCO/VICODIN) 5-325 MG tablet  Every 6 hours PRN        07/31/22 1650              Bill Salinas, PA-C 07/31/22 1702    Virgina Norfolk, DO 07/31/22 2229

## 2022-07-31 NOTE — ED Notes (Signed)
Arrived via [facility], s/p fall at skating rink. Obvious deformity to left wrist, CMS intact. No LOC, VSS

## 2022-07-31 NOTE — Sedation Documentation (Signed)
Pt moving arm, unable to get BP

## 2022-07-31 NOTE — Progress Notes (Signed)
RT was on standby thru the conscious sedation. Patient had a 2L ETCO2 [place] on while procedure was done. Her ETCO2 maintained at 41. Patient saturation never decreased below 99%.

## 2022-07-31 NOTE — ED Notes (Signed)
Pt now says she feels lightheaded. Denies hitting head.

## 2022-08-02 ENCOUNTER — Other Ambulatory Visit (HOSPITAL_BASED_OUTPATIENT_CLINIC_OR_DEPARTMENT_OTHER): Payer: Self-pay

## 2022-08-03 ENCOUNTER — Other Ambulatory Visit: Payer: Self-pay

## 2022-08-03 ENCOUNTER — Encounter (HOSPITAL_BASED_OUTPATIENT_CLINIC_OR_DEPARTMENT_OTHER): Payer: Self-pay | Admitting: Orthopedic Surgery

## 2022-08-03 ENCOUNTER — Ambulatory Visit (INDEPENDENT_AMBULATORY_CARE_PROVIDER_SITE_OTHER): Payer: BC Managed Care – PPO | Admitting: Orthopedic Surgery

## 2022-08-03 DIAGNOSIS — S52532A Colles' fracture of left radius, initial encounter for closed fracture: Secondary | ICD-10-CM | POA: Diagnosis not present

## 2022-08-03 DIAGNOSIS — S52502A Unspecified fracture of the lower end of left radius, initial encounter for closed fracture: Secondary | ICD-10-CM | POA: Insufficient documentation

## 2022-08-03 NOTE — Progress Notes (Signed)
Office Visit Note   Patient: Crystal Callahan           Date of Birth: Mar 11, 1971           MRN: [id number] Visit Date: 08/03/2022              Requested by: Blair Heys, MD [address] [place],  [place] [address] PCP: Blair Heys, MD   Assessment & Plan: Visit Diagnoses:  1. Closed Colles' fracture of left radius, initial encounter     Plan: We reviewed patient's x-rays from the ER which demonstrate a comminuted left distal radius fracture.  There are some question as to whether the fracture line since the articular surface.  The fracture line is much improved following closed duction in the ER.  We discussed the nature of this injury and both conservative and surgical treatment options.  Conservative treatment would involve cast immobilization for a prolonged period.  We discussed the risks of this treatment modality including stiffness, possibly for fracture displacement, incomplete symptom relief, need for surgery if the fracture were to displace further.  We also discussed surgical treatment with open reduction internal fixation.  We reviewed risks of surgery including bleeding, infection, damage to neurovascular structures, incomplete symptom relief, stiffness, nonunion, malunion, need for additional procedures.  We discussed the expected postoperative course and recovery process with hand therapy.  After our discussion, patient like to proceed with ORIF of her fracture.  We will plan to do this on Thursday afternoon.  Follow-Up Instructions: No follow-ups on file.   Orders:  No orders of the defined types were placed in this encounter.  No orders of the defined types were placed in this encounter.     Procedures: No procedures performed   Clinical Data: No additional findings.   Subjective: Chief Complaint  Patient presents with   Left Wrist - Fracture    This is a 51 year old right-hand-dominant female presents with a closed, left distal  radius fracture after a fall rollerskating on Friday.  Patient was seen in the ER where x-rays were obtained which demonstrated a comminuted distal radius fracture with questionable articular extension.  She underwent closed reduction in the ER with improved fracture alignment.  She has been placed in a short arm splint.  Her pain today is 3/10 at worst.  She did does not have any numbness or paresthesias in her fingers.  She does have some swelling secondary to her splint.  She has not been elevating the wrist much and has been in a sling.  She denies pain elsewhere in the extremity.    Review of Systems   Objective: Vital Signs: LMP 07/26/2022 (Exact Date)   Physical Exam Constitutional:      Appearance: Normal appearance.  Cardiovascular:     Rate and Rhythm: Normal rate.     Pulses: Normal pulses.  Pulmonary:     Effort: Pulmonary effort is normal.  Skin:    General: Skin is warm and dry.     Capillary Refill: Capillary refill takes less than 2 seconds.  Neurological:     Mental Status: She is alert.     Left Hand Exam   Other  Erythema: absent Sensation: normal Pulse: present  Comments:  Sugartong splint clean and dry.  Flexion/extension of fingers intact w/in limits of splint.  SILT in exposed fingers which are warm and well perfused.       Specialty Comments:  No specialty comments available.  Imaging: No results  found.   PMFS History: Patient Active Problem List   Diagnosis Date Noted   Closed fracture of left distal radius 08/03/2022   Past Medical History:  Diagnosis Date   Depression    Hx of seasonal allergies    Migraine     No family history on file.  No past surgical history on file. Social History   Occupational History   Not on file  Tobacco Use   Smoking status: Never   Smokeless tobacco: Never  Vaping Use   Vaping Use: Never used  Substance and Sexual Activity   Alcohol use: Yes    Comment: weekly   Drug use: Never   Sexual  activity: Not on file        

## 2022-08-03 NOTE — H&P (View-Only) (Signed)
Office Visit Note   Patient: Crystal Callahan           Date of Birth: Mar 11, 1971           MRN: [id number] Visit Date: 08/03/2022              Requested by: Blair Heys, MD [address] [place],  [place] [address] PCP: Blair Heys, MD   Assessment & Plan: Visit Diagnoses:  1. Closed Colles' fracture of left radius, initial encounter     Plan: We reviewed patient's x-rays from the ER which demonstrate a comminuted left distal radius fracture.  There are some question as to whether the fracture line since the articular surface.  The fracture line is much improved following closed duction in the ER.  We discussed the nature of this injury and both conservative and surgical treatment options.  Conservative treatment would involve cast immobilization for a prolonged period.  We discussed the risks of this treatment modality including stiffness, possibly for fracture displacement, incomplete symptom relief, need for surgery if the fracture were to displace further.  We also discussed surgical treatment with open reduction internal fixation.  We reviewed risks of surgery including bleeding, infection, damage to neurovascular structures, incomplete symptom relief, stiffness, nonunion, malunion, need for additional procedures.  We discussed the expected postoperative course and recovery process with hand therapy.  After our discussion, patient like to proceed with ORIF of her fracture.  We will plan to do this on Thursday afternoon.  Follow-Up Instructions: No follow-ups on file.   Orders:  No orders of the defined types were placed in this encounter.  No orders of the defined types were placed in this encounter.     Procedures: No procedures performed   Clinical Data: No additional findings.   Subjective: Chief Complaint  Patient presents with   Left Wrist - Fracture    This is a 51 year old right-hand-dominant female presents with a closed, left distal  radius fracture after a fall rollerskating on Friday.  Patient was seen in the ER where x-rays were obtained which demonstrated a comminuted distal radius fracture with questionable articular extension.  She underwent closed reduction in the ER with improved fracture alignment.  She has been placed in a short arm splint.  Her pain today is 3/10 at worst.  She did does not have any numbness or paresthesias in her fingers.  She does have some swelling secondary to her splint.  She has not been elevating the wrist much and has been in a sling.  She denies pain elsewhere in the extremity.    Review of Systems   Objective: Vital Signs: LMP 07/26/2022 (Exact Date)   Physical Exam Constitutional:      Appearance: Normal appearance.  Cardiovascular:     Rate and Rhythm: Normal rate.     Pulses: Normal pulses.  Pulmonary:     Effort: Pulmonary effort is normal.  Skin:    General: Skin is warm and dry.     Capillary Refill: Capillary refill takes less than 2 seconds.  Neurological:     Mental Status: She is alert.     Left Hand Exam   Other  Erythema: absent Sensation: normal Pulse: present  Comments:  Sugartong splint clean and dry.  Flexion/extension of fingers intact w/in limits of splint.  SILT in exposed fingers which are warm and well perfused.       Specialty Comments:  No specialty comments available.  Imaging: No results  found.   PMFS History: Patient Active Problem List   Diagnosis Date Noted   Closed fracture of left distal radius 08/03/2022   Past Medical History:  Diagnosis Date   Depression    Hx of seasonal allergies    Migraine     No family history on file.  No past surgical history on file. Social History   Occupational History   Not on file  Tobacco Use   Smoking status: Never   Smokeless tobacco: Never  Vaping Use   Vaping Use: Never used  Substance and Sexual Activity   Alcohol use: Yes    Comment: weekly   Drug use: Never   Sexual  activity: Not on file

## 2022-08-05 ENCOUNTER — Ambulatory Visit (HOSPITAL_BASED_OUTPATIENT_CLINIC_OR_DEPARTMENT_OTHER): Payer: BC Managed Care – PPO | Admitting: Anesthesiology

## 2022-08-05 ENCOUNTER — Ambulatory Visit (HOSPITAL_COMMUNITY)
Admission: RE | Admit: 2022-08-05 | Discharge: 2022-08-05 | Disposition: A | Discharge status: Home / Self Care | Payer: BC Managed Care – PPO | Attending: Orthopedic Surgery | Admitting: Orthopedic Surgery

## 2022-08-05 ENCOUNTER — Encounter (HOSPITAL_BASED_OUTPATIENT_CLINIC_OR_DEPARTMENT_OTHER): Payer: Self-pay | Admitting: Orthopedic Surgery

## 2022-08-05 ENCOUNTER — Encounter (HOSPITAL_BASED_OUTPATIENT_CLINIC_OR_DEPARTMENT_OTHER)
Admission: RE | Disposition: A | Discharge status: Home / Self Care | Payer: Self-pay | Source: Home / Self Care | Attending: Orthopedic Surgery

## 2022-08-05 ENCOUNTER — Other Ambulatory Visit: Payer: Self-pay

## 2022-08-05 ENCOUNTER — Ambulatory Visit (HOSPITAL_BASED_OUTPATIENT_CLINIC_OR_DEPARTMENT_OTHER): Payer: BC Managed Care – PPO

## 2022-08-05 DIAGNOSIS — S52532A Colles' fracture of left radius, initial encounter for closed fracture: Secondary | ICD-10-CM

## 2022-08-05 DIAGNOSIS — Y9351 Activity, roller skating (inline) and skateboarding: Secondary | ICD-10-CM | POA: Insufficient documentation

## 2022-08-05 DIAGNOSIS — W19XXXA Unspecified fall, initial encounter: Secondary | ICD-10-CM | POA: Diagnosis not present

## 2022-08-05 DIAGNOSIS — Z01818 Encounter for other preprocedural examination: Secondary | ICD-10-CM

## 2022-08-05 DIAGNOSIS — S52502A Unspecified fracture of the lower end of left radius, initial encounter for closed fracture: Secondary | ICD-10-CM | POA: Diagnosis not present

## 2022-08-05 DIAGNOSIS — G8918 Other acute postprocedural pain: Secondary | ICD-10-CM | POA: Diagnosis not present

## 2022-08-05 DIAGNOSIS — S52552A Other extraarticular fracture of lower end of left radius, initial encounter for closed fracture: Secondary | ICD-10-CM | POA: Diagnosis not present

## 2022-08-05 HISTORY — PX: OPEN REDUCTION INTERNAL FIXATION (ORIF) DISTAL RADIAL FRACTURE: SHX5989

## 2022-08-05 LAB — POCT PREGNANCY, URINE: Preg Test, Ur: NEGATIVE

## 2022-08-05 SURGERY — OPEN REDUCTION INTERNAL FIXATION (ORIF) DISTAL RADIUS FRACTURE
Anesthesia: General | Site: Wrist | Laterality: Left

## 2022-08-05 MED ORDER — CEFAZOLIN SODIUM-DEXTROSE 2-4 GM/100ML-% IV SOLN
2.0000 g | INTRAVENOUS | Status: AC
  Administered 2022-08-05: 2 g via INTRAVENOUS

## 2022-08-05 MED ORDER — OXYCODONE HCL 5 MG/5ML PO SOLN: 5.0000 mg | Freq: Once | ORAL | Status: DC | PRN

## 2022-08-05 MED ORDER — FENTANYL CITRATE (PF) 100 MCG/2ML IJ SOLN
INTRAMUSCULAR | Status: DC | PRN
Start: 2022-08-05 — End: 2022-08-05
  Administered 2022-08-05: 50 ug via INTRAVENOUS

## 2022-08-05 MED ORDER — ONDANSETRON HCL 4 MG/2ML IJ SOLN
INTRAMUSCULAR | Status: DC | PRN
  Administered 2022-08-05: 4 mg via INTRAVENOUS

## 2022-08-05 MED ORDER — ROPIVACAINE HCL 7.5 MG/ML IJ SOLN
INTRAMUSCULAR | Status: DC | PRN
  Administered 2022-08-05: 25 mL via PERINEURAL

## 2022-08-05 MED ORDER — CEFAZOLIN SODIUM-DEXTROSE 2-4 GM/100ML-% IV SOLN
INTRAVENOUS | Status: AC
  Filled 2022-08-05: qty 100

## 2022-08-05 MED ORDER — FENTANYL CITRATE (PF) 100 MCG/2ML IJ SOLN
INTRAMUSCULAR | Status: AC
  Filled 2022-08-05: qty 2

## 2022-08-05 MED ORDER — PROPOFOL 10 MG/ML IV BOLUS
INTRAVENOUS | Status: AC
  Filled 2022-08-05: qty 20

## 2022-08-05 MED ORDER — LACTATED RINGERS IV SOLN: INTRAVENOUS | Status: DC

## 2022-08-05 MED ORDER — MIDAZOLAM HCL 2 MG/2ML IJ SOLN
INTRAMUSCULAR | Status: AC
  Filled 2022-08-05: qty 2

## 2022-08-05 MED ORDER — ACETAMINOPHEN 325 MG PO TABS: 325.0000 mg | ORAL_TABLET | ORAL | Status: DC | PRN

## 2022-08-05 MED ORDER — 0.9 % SODIUM CHLORIDE (POUR BTL) OPTIME
TOPICAL | Status: DC | PRN
  Administered 2022-08-05: 100 mL

## 2022-08-05 MED ORDER — MIDAZOLAM HCL 2 MG/2ML IJ SOLN
2.0000 mg | Freq: Once | INTRAMUSCULAR | Status: AC
  Administered 2022-08-05: 2 mg via INTRAVENOUS

## 2022-08-05 MED ORDER — DEXAMETHASONE SODIUM PHOSPHATE 10 MG/ML IJ SOLN
INTRAMUSCULAR | Status: DC | PRN
  Administered 2022-08-05: 10 mg via INTRAVENOUS

## 2022-08-05 MED ORDER — ONDANSETRON HCL 4 MG/2ML IJ SOLN
INTRAMUSCULAR | Status: AC
Start: 2022-08-05 — End: ?
  Filled 2022-08-05: qty 2

## 2022-08-05 MED ORDER — ACETAMINOPHEN 160 MG/5ML PO SOLN: 325.0000 mg | ORAL | Status: DC | PRN

## 2022-08-05 MED ORDER — MEPERIDINE HCL 25 MG/ML IJ SOLN: 6.2500 mg | INTRAMUSCULAR | Status: DC | PRN

## 2022-08-05 MED ORDER — FENTANYL CITRATE (PF) 100 MCG/2ML IJ SOLN: 25.0000 ug | INTRAMUSCULAR | Status: DC | PRN

## 2022-08-05 MED ORDER — OXYCODONE HCL 5 MG PO TABS: 5.0000 mg | ORAL_TABLET | Freq: Once | ORAL | Status: DC | PRN

## 2022-08-05 MED ORDER — FENTANYL CITRATE (PF) 100 MCG/2ML IJ SOLN
100.0000 ug | Freq: Once | INTRAMUSCULAR | Status: AC
  Administered 2022-08-05: 100 ug via INTRAVENOUS

## 2022-08-05 MED ORDER — LIDOCAINE HCL (CARDIAC) PF 100 MG/5ML IV SOSY
PREFILLED_SYRINGE | INTRAVENOUS | Status: DC | PRN
  Administered 2022-08-05: 20 mg via INTRATRACHEAL

## 2022-08-05 MED ORDER — CLONIDINE HCL (ANALGESIA) 100 MCG/ML EP SOLN
EPIDURAL | Status: DC | PRN
  Administered 2022-08-05: 100 ug

## 2022-08-05 MED ORDER — LIDOCAINE 2% (20 MG/ML) 5 ML SYRINGE
INTRAMUSCULAR | Status: AC
  Filled 2022-08-05: qty 5

## 2022-08-05 MED ORDER — OXYCODONE HCL 5 MG PO TABS: 5.0000 mg | ORAL_TABLET | Freq: Four times a day (QID) | ORAL | 0 refills | Status: AC | PRN

## 2022-08-05 MED ORDER — PROPOFOL 10 MG/ML IV BOLUS
INTRAVENOUS | Status: DC | PRN
  Administered 2022-08-05: 150 mg via INTRAVENOUS

## 2022-08-05 MED ORDER — ONDANSETRON HCL 4 MG/2ML IJ SOLN
4.0000 mg | Freq: Once | INTRAMUSCULAR | Status: DC | PRN
Start: 2022-08-05 — End: 2022-08-05

## 2022-08-05 SURGICAL SUPPLY — 59 items
APL PRP STRL LF DISP 70% ISPRP (MISCELLANEOUS) ×1
BIT DRILL SOLID 2.0X40MM (BIT) IMPLANT
BIT DRILL SOLID 2.5X40MM (BIT) IMPLANT
BLADE SURG 15 STRL LF DISP TIS (BLADE) ×1 IMPLANT
BLADE SURG 15 STRL SS (BLADE) ×1
BNDG CMPR 9X4 STRL LF SNTH (GAUZE/BANDAGES/DRESSINGS) ×1
BNDG ELASTIC 3X5.8 VLCR STR LF (GAUZE/BANDAGES/DRESSINGS) ×1 IMPLANT
BNDG ESMARK 4X9 LF (GAUZE/BANDAGES/DRESSINGS) ×1 IMPLANT
BNDG GAUZE DERMACEA FLUFF (GAUZE/BANDAGES/DRESSINGS) ×1
BNDG GAUZE DERMACEA FLUFF 4 (GAUZE/BANDAGES/DRESSINGS) ×1 IMPLANT
BNDG GZE DERMACEA 4 6PLY (GAUZE/BANDAGES/DRESSINGS) ×1
BNDG PLASTER X FAST 3X3 WHT LF (CAST SUPPLIES) ×10 IMPLANT
BNDG PLSTR 9X3 FST ST WHT (CAST SUPPLIES) ×10
CHLORAPREP W/TINT 26 (MISCELLANEOUS) ×1 IMPLANT
CORD BIPOLAR FORCEPS 12FT (ELECTRODE) ×1 IMPLANT
COVER BACK TABLE 60X90IN (DRAPES) ×1 IMPLANT
COVER MAYO STAND STRL (DRAPES) ×1 IMPLANT
CUFF TOURN SGL QUICK 18X4 (TOURNIQUET CUFF) ×1 IMPLANT
CUFF TOURN SGL QUICK 24 (TOURNIQUET CUFF)
CUFF TRNQT CYL 24X4X16.5-23 (TOURNIQUET CUFF) IMPLANT
DRAPE EXTREMITY T 121X128X90 (DISPOSABLE) ×1 IMPLANT
DRAPE OEC MINIVIEW 54X84 (DRAPES) ×1 IMPLANT
DRAPE SURG 17X23 STRL (DRAPES) ×1 IMPLANT
DRILL SOLID 2.0X40MM (BIT) ×1
DRILL SOLID 2.5X40MM (BIT) ×1
GAUZE SPONGE 4X4 12PLY STRL (GAUZE/BANDAGES/DRESSINGS) ×1 IMPLANT
GAUZE XEROFORM 1X8 LF (GAUZE/BANDAGES/DRESSINGS) IMPLANT
GLOVE BIO SURGEON STRL SZ7 (GLOVE) ×1 IMPLANT
GOWN STRL REUS W/ TWL LRG LVL3 (GOWN DISPOSABLE) ×1 IMPLANT
GOWN STRL REUS W/ TWL XL LVL3 (GOWN DISPOSABLE) ×1 IMPLANT
GOWN STRL REUS W/TWL LRG LVL3 (GOWN DISPOSABLE) ×1
GOWN STRL REUS W/TWL XL LVL3 (GOWN DISPOSABLE) ×1
GUIDE AIMING 1.5MM (WIRE) IMPLANT
NDL HYPO 25X1 1.5 SAFETY (NEEDLE) IMPLANT
NEEDLE HYPO 25X1 1.5 SAFETY (NEEDLE) IMPLANT
NS IRRIG 1000ML POUR BTL (IV SOLUTION) ×1 IMPLANT
PACK BASIN DAY SURGERY FS (CUSTOM PROCEDURE TRAY) ×1 IMPLANT
PAD CAST 3X4 CTTN HI CHSV (CAST SUPPLIES) ×1 IMPLANT
PADDING CAST COTTON 3X4 STRL (CAST SUPPLIES) ×1
PEG GEMINUS SMOOTH LOCK 2.0X19 (Peg) IMPLANT
PEG GEMINUS THRD TPNL 2.7X20 (Peg) IMPLANT
PEG GEMINUS THRD TPNL 2.7X22 (Peg) IMPLANT
PEG SMOOTH LOCK 2.0X18 (Screw) IMPLANT
PEG SMOOTH LOCKING 20MM (Peg) IMPLANT
PLATE STD 3H LEFT (Plate) IMPLANT
SCREW POLY NON LOCK 3.5MMX12MM (Screw) IMPLANT
SCREW POLY NON LOCK 3.5MMX14MM (Screw) IMPLANT
SCREWDRIVER SURG ST 2 (INSTRUMENTS) IMPLANT
SLEEVE SCD COMPRESS KNEE MED (STOCKING) IMPLANT
SLING ARM FOAM STRAP LRG (SOFTGOODS) IMPLANT
SUT ETHILON 4 0 PS 2 18 (SUTURE) ×1 IMPLANT
SUT MNCRL AB 3-0 PS2 18 (SUTURE) ×1 IMPLANT
SUT VICRYL 4-0 PS2 18IN ABS (SUTURE) IMPLANT
SYR BULB EAR ULCER 3OZ GRN STR (SYRINGE) ×1 IMPLANT
SYR CONTROL 10ML LL (SYRINGE) IMPLANT
TOWEL GREEN STERILE FF (TOWEL DISPOSABLE) ×2 IMPLANT
UNDERPAD 30X36 HEAVY ABSORB (UNDERPADS AND DIAPERS) ×1 IMPLANT
WIRE FIX 1.5 STANDARD TIP (WIRE) ×2
WIRE FIX 1.5 STD TIP (WIRE) IMPLANT

## 2022-08-05 NOTE — Op Note (Signed)
Date of Surgery: 08/05/2022  INDICATIONS: Patient is a 51 y.o.-year-old female with a closed, left extra-articular distal radius fracture.  Risks, benefits, and alternatives to surgery were again discussed with the patient in the preoperative area. The patient wishes to proceed with surgery.  Informed consent was signed after our discussion.   PREOPERATIVE DIAGNOSIS:  Left distal radius fracture, extra-articular  POSTOPERATIVE DIAGNOSIS: Same.  PROCEDURE:  ORIF left distal radius fracture   SURGEON: Audria Nine, M.D.  ASSIST:   ANESTHESIA:  Regional, MAC  IV FLUIDS AND URINE: See anesthesia.  ESTIMATED BLOOD LOSS: <5 mL.  IMPLANTS:  Implant Name Type Inv. Item Serial No. Manufacturer Lot No. LRB No. Used Action  PLATE STD 3H LEFT - SOPENED ON STERILE FIELD Plate PLATE STD 3H LEFT OPENED ON STERILE FIELD SKELETAL [employer]YNAM[employer]  Left 1 Implanted  SCREW POLY NON LOCK 3.0XFG18EX - SOPEMED ON STERILE FIELD Screw SCREW POLY NON LOCK 3.9BZJ69CV OPEMED ON STERILE FIELD SKELETAL DYNAMICS  Left 1 Implanted  PEG GEMINUS THRD TPNL 2.7X22 - SOPENED ON STERILE FIELD Peg PEG GEMINUS THRD TPNL 2.7X22 OPENED ON STERILE FIELD SKELETAL DYNAMICS  Left 1 Implanted  PEG GEMINUS THRD TPNL 2.7X20 - SOPENED ON STERILE FILED Peg PEG GEMINUS THRD TPNL 2.7X20 OPENED ON STERILE FILED SKELETAL DYNAMICS  Left 1 Implanted  PEG SMOOTH LOCK 2.0X18 - SOPENED ON STERILE FIELD Screw PEG SMOOTH LOCK 2.0X18 OPENED ON STERILE FIELD SKELETAL DYNAMICS  Left 3 Implanted  PEG GEMINUS SMOOTH LOCK 2.0X19 - SOPENED ON STERILE FIELD Peg PEG GEMINUS SMOOTH LOCK 2.0X19 OPENED ON STERILE FIELD SKELETAL DYNAMICS  Left 1 Implanted  PEG SMOOTH LOCKING 20MM - SOPENED ON STERILE FIELD Peg PEG SMOOTH LOCKING 20MM OPENED ON STERILE FIELD SKELETAL DYNAMICS  Left 2 Implanted  PEG GEMINUS SMOOTH LOCK 2.0X19 - SOPENED ON STERILE FIELD Peg PEG GEMINUS SMOOTH LOCK 2.0X19 OPENED ON STERILE FIELD SKELETAL DYNAMICS  Left 2 Implanted  SCREW POLY  NON LOCK 3.8LFY10FB - SOPENED ON STERILE FIELD Screw SCREW POLY NON LOCK 3.5ZWC58NI OPENED ON STERILE FIELD SKELETAL DYNAMICS  Left 3 Implanted     DRAINS: None  COMPLICATIONS: None  DESCRIPTION OF PROCEDURE: The patient was met in the preoperative holding area where the surgical site was marked and the consent form was verified.  The patient was then taken to the operating room and transferred to the operating table.  All bony prominences were well padded.  A tourniquet was applied to the left upper arm.  Monitored sedation was induced.  The operative extremity was prepped and draped in the usual and sterile fashion.  A formal time-out was performed to confirm that this was the correct patient, surgery, side, and site.   Following a formal timeout, the limb was gently exsanguinated and the tourniquet inflated to 250 mmHg.  An extended FCR approach was designed at the volar aspect of the radius.  The skin was incised.  Blunt dissection was used to identify the FCR tendon sheath.  Volar portion of the sheath was incised.  The FCR tendon was retracted ulnarly.  The floor of the FCR sheath was incised to the level of the radiocarpal joint.  Blunt dissection was used to develop Parona space.  The FPL tendon and muscle belly were retracted ulnarly.  Blunt dissection was used at the radial septum to identify the brachial radialis tendon.  The tendon was dissected free distally and a tenotomy was performed.  The volar aspect the wrist was a lot incised along its radial and  volar margin to the level of the transitional fibrous zone at the distal aspect of the printer quadratus.  The pronator quadratus was subperiosteally dissected off of the volar aspect of the radius.  The fracture site was identified.  A small dental pick was used to free the fracture site of any debris.  The fracture was reduced with a combination of traction and flexion of the wrist and volar translation of the carpus.  A 3 hole standard plate  was chosen.  The plate was placed onto the volar aspect of the radius.  Appropriate position was determined in the proximal distal direction on both AP and lateral views.  A screw was placed in the oblong hole of the plate.  The distal fracture meant was reduced using a combination of flexion of the wrist and volar translation of the carpus.  2 K wires were placed in the aiming guides and the ulnar and radial heads of the plate.  Both wires appear to be in appropriate subchondral position.  The distal 2 screws were filled with bicortical nonlocking screws to allow compression of the plate to the bone.  The remainder of the screws were filled with smooth locking pegs taking care that the pegs did not penetrate the dorsal cortex of the radius.  The previously placed bicortical screws were placed with unicortical smooth locking pegs.  The previously placed aiming wires were removed and replaced with unicortical smooth locking pegs.  Lateral fluoroscopic view demonstrated appropriate restoration of volar tilt and appropriate subchondral screw position. The screws penetrate the dorsal cortex.  The remaining 2 holes in the shaft were then filled with bicortical nonlocking screws.  There was appropriate purchase in all proximal screws.  All the distal locking screws were checked to ensure that they were fully locked in the plate.  Final AP lateral and 30 degree lateral fluoroscopic imaging demonstrated appropriate restoration of the radial height, radial inclination, and volar tilt.  No screws penetrated the articular surface and were in a good subchondral position with a 30 degree lateral view.  With the radius fixed, the wound was thoroughly irrigated with copious sterile saline.  The tourniquet was let down hemostasis was achieved with bipolar cautery.  The wound was then closed using a 3-0 Monocryl suture in a buried erupted fashion followed load by a 4-0 nylon suture in a horizontal mattress fashion.  The fingers  were warm, pink, and well-perfused within the procedure.  The wounds were then dressed with Xeroform, folded Kerlix, cast padding, and a well-padded volar splint was applied.  The patient was then reversed of anesthesia and transferred to the postoperative bed.  All counts were correct at the end of the procedure.  The patient was then taken the PACU in stable condition.  POSTOPERATIVE PLAN: She will be discharged home with appropriate pain medication discharge instructions.  See back in the office in 10 to 14 days for first postop visit.  A referral will be made for her to see hand therapy to start the rehab process.  Audria Nine, MD 2:44 PM

## 2022-08-05 NOTE — Anesthesia Procedure Notes (Signed)
Anesthesia Regional Block: Supraclavicular block   Pre-Anesthetic Checklist: , timeout performed,  Correct Patient, Correct Site, Correct Laterality,  Correct Procedure, Correct Position, site marked,  Risks and benefits discussed,  Surgical consent,  Pre-op evaluation,  At surgeon's request and post-op pain management  Laterality: Left  Prep: chloraprep       Needles:  Injection technique: Single-shot  Needle Type: Echogenic Stimulator Needle     Needle Length: 5cm  Needle Gauge: 22     Additional Needles:   Procedures:, nerve stimulator,,, ultrasound used (permanent image in chart),,     Nerve Stimulator or Paresthesia:  Response: hand, 0.45 mA  Additional Responses:   Narrative:  Start time: 08/05/2022 11:00 AM End time: 08/05/2022 11:15 AM Injection made incrementally with aspirations every 5 mL.  Performed by: Personally  Anesthesiologist: Bethena Midget, MD  Additional Notes: Functioning IV was confirmed and monitors were applied.  A 74mm 22ga Arrow echogenic stimulator needle was used. Sterile prep and drape,hand hygiene and sterile gloves were used. Ultrasound guidance: relevant anatomy identified, needle position confirmed, local anesthetic spread visualized around nerve(s)., vascular puncture avoided.  Image printed for medical record. Negative aspiration and negative test dose prior to incremental administration of local anesthetic. The patient tolerated the procedure well.

## 2022-08-05 NOTE — Discharge Instructions (Addendum)
  Charles Benfield, M.D. Hand Surgery  POST-OPERATIVE DISCHARGE INSTRUCTIONS   PRESCRIPTIONS: You may have been given a prescription to be taken as directed for post-operative pain control.  You may also take over the counter ibuprofen/aleve and tylenol for pain. Take this as directed on the packaging. Do not exceed 3000 mg tylenol/acetaminophen in 24 hours.  Ibuprofen 600-800 mg (3-4) tablets by mouth every 6 hours as needed for pain.  OR Aleve 2 tablets by mouth every 12 hours (twice daily) as needed for pain.  AND/OR Tylenol 1000 mg (2 tablets) every 8 hours as needed for pain.  Please use your pain medication carefully, as refills are limited and you may not be provided with one.  As stated above, please use over the counter pain medicine - it will also be helpful with decreasing your swelling.    ANESTHESIA: After your surgery, post-surgical discomfort or pain is likely. This discomfort can last several days to a few weeks. At certain times of the day your discomfort may be more intense.   Did you receive a nerve block?  A nerve block can provide pain relief for one hour to two days after your surgery. As long as the nerve block is working, you will experience little or no sensation in the area the surgeon operated on.  As the nerve block wears off, you will begin to experience pain or discomfort. It is very important that you begin taking your prescribed pain medication before the nerve block fully wears off. Treating your pain at the first sign of the block wearing off will ensure your pain is better controlled and more tolerable when full-sensation returns. Do not wait until the pain is intolerable, as the medicine will be less effective. It is better to treat pain in advance than to try and catch up.   General Anesthesia:  If you did not receive a nerve block during your surgery, you will need to start taking your pain medication shortly after your surgery and should continue  to do so as prescribed by your surgeon.     ICE AND ELEVATION: You may use ice for the first 48-72 hours, but it is not critical.   Motion of your fingers is very important to decrease the swelling.  Elevation, as much as possible for the next 48 hours, is critical for decreasing swelling as well as for pain relief. Elevation means when you are seated or lying down, you hand should be at or above your heart. When walking, the hand needs to be at or above the level of your elbow.  If the bandage gets too tight, it may need to be loosened. Please contact our office and we will instruct you in how to do this.    SURGICAL BANDAGES:  Keep your dressing and/or splint clean and dry at all times.  Do not remove until you are seen again in the office.  If careful, you may place a plastic bag over your bandage and tape the end to shower, but be careful, do not get your bandages wet.     HAND THERAPY:  You may not need any. If you do, we will begin this at your follow up visit in the clinic.    ACTIVITY AND WORK: You are encouraged to move any fingers which are not in the bandage.  Light use of the fingers is allowed to assist the other hand with daily hygiene and eating, but strong gripping or lifting is often uncomfortable and   should be avoided.  You might miss a variable period of time from work and hopefully this issue has been discussed prior to surgery. You may not do any heavy work with your affected hand for about 2 weeks.    [facility] [address] [place],  [place]  [address] [phone]  Regional Anesthesia Blocks  1. Numbness or the inability to move the "blocked" extremity may last from 3-48 hours after placement. The length of time depends on the medication injected and your individual response to the medication. If the numbness is not going away after 48 hours, call your surgeon.  2. The extremity that is blocked will need to be protected until the  numbness is gone and the  Strength has returned. Because you cannot feel it, you will need to take extra care to avoid injury. Because it may be weak, you may have difficulty moving it or using it. You may not know what position it is in without looking at it while the block is in effect.  3. For blocks in the legs and feet, returning to weight bearing and walking needs to be done carefully. You will need to wait until the numbness is entirely gone and the strength has returned. You should be able to move your leg and foot normally before you try and bear weight or walk. You will need someone to be with you when you first try to ensure you do not fall and possibly risk injury.  4. Bruising and tenderness at the needle site are common side effects and will resolve in a few days.  5. Persistent numbness or new problems with movement should be communicated to the surgeon or the [facility] [phone] [facility] [phone]).

## 2022-08-05 NOTE — Interval H&P Note (Signed)
History and Physical Interval Note:  08/05/2022 12:45 PM  Crystal Callahan  has presented today for surgery, with the diagnosis of LEFT DISTAL RADIUS FRACTURE.  The various methods of treatment have been discussed with the patient and family. After consideration of risks, benefits and other options for treatment, the patient has consented to  Procedure(s): LEFT OPEN REDUCTION INTERNAL FIXATION (ORIF) DISTAL RADIUS FRACTURE (Left) as a surgical intervention.  The patient's history has been reviewed, patient examined, no change in status, stable for surgery.  I have reviewed the patient's chart and labs.  Questions were answered to the patient's satisfaction.     Cathy Ropp Kynan Peasley

## 2022-08-05 NOTE — Transfer of Care (Signed)
Immediate Anesthesia Transfer of Care Note  Patient: Crystal Callahan  Procedure(s) Performed: LEFT OPEN REDUCTION INTERNAL FIXATION (ORIF) DISTAL RADIUS FRACTURE (Left: Wrist)  Patient Location: PACU  Anesthesia Type:General  Level of Consciousness: drowsy and patient cooperative  Airway & Oxygen Therapy: Patient Spontanous Breathing and Patient connected to face mask oxygen  Post-op Assessment: Report given to RN and Post -op Vital signs reviewed and stable  Post vital signs: Reviewed and stable  Last Vitals:  Vitals Value Taken Time  BP 124/84 08/05/22 1452  Temp    Pulse 90 08/05/22 1453  Resp 12 08/05/22 1453  SpO2 97 % 08/05/22 1453  Vitals shown include unvalidated device data.  Last Pain:  Vitals:   08/05/22 1039  TempSrc: Oral  PainSc: 3       Patients Stated Pain Goal: 6 (08/05/22 1039)  Complications: No notable events documented.

## 2022-08-05 NOTE — Progress Notes (Signed)
Assisted Dr. Oddono with left, supraclavicular, ultrasound guided block. Side rails up, monitors on throughout procedure. See vital signs in flow sheet. Tolerated Procedure well. 

## 2022-08-05 NOTE — Anesthesia Preprocedure Evaluation (Addendum)
Anesthesia Evaluation  Patient identified by MRN, date of birth, ID band Patient awake    Reviewed: Allergy & Precautions, H&P , NPO status , Patient's Chart, lab work & pertinent test results, reviewed documented beta blocker date and time   Airway Mallampati: II  TM Distance: >3 FB Neck ROM: full    Dental no notable dental hx.    Pulmonary neg pulmonary ROS,    Pulmonary exam normal breath sounds clear to auscultation       Cardiovascular Exercise Tolerance: Good negative cardio ROS   Rhythm:regular Rate:Normal     Neuro/Psych  Headaches, PSYCHIATRIC DISORDERS Depression    GI/Hepatic negative GI ROS, Neg liver ROS,   Endo/Other  negative endocrine ROS  Renal/GU negative Renal ROS  negative genitourinary   Musculoskeletal   Abdominal   Peds  Hematology negative hematology ROS (+)   Anesthesia Other Findings   Reproductive/Obstetrics negative OB ROS                             Anesthesia Physical Anesthesia Plan  ASA: 2  Anesthesia Plan: General   Post-op Pain Management: Minimal or no pain anticipated and Regional block*   Induction:   PONV Risk Score and Plan: 3 and Ondansetron, Dexamethasone and Midazolam  Airway Management Planned: Oral ETT and LMA  Additional Equipment:   Intra-op Plan:   Post-operative Plan: Extubation in OR  Informed Consent: I have reviewed the patients History and Physical, chart, labs and discussed the procedure including the risks, benefits and alternatives for the proposed anesthesia with the patient or authorized representative who has indicated his/her understanding and acceptance.     Dental Advisory Given  Plan Discussed with: CRNA and Anesthesiologist  Anesthesia Plan Comments: (Discussed both nerve block for pain relief post-op and GA; including NV, sore throat, dental injury, and pulmonary complications)        Anesthesia  Quick Evaluation

## 2022-08-05 NOTE — Anesthesia Procedure Notes (Signed)
Procedure Name: LMA Insertion Date/Time: 08/05/2022 1:13 PM  Performed by: Thornell Mule, CRNAPre-anesthesia Checklist: Patient identified, Emergency Drugs available, Suction available and Patient being monitored Patient Re-evaluated:Patient Re-evaluated prior to induction Oxygen Delivery Method: Circle system utilized Preoxygenation: Pre-oxygenation with 100% oxygen Induction Type: IV induction Ventilation: Mask ventilation without difficulty LMA: LMA inserted LMA Size: 4.0 Number of attempts: 1 Placement Confirmation: positive ETCO2 Tube secured with: Tape Dental Injury: Teeth and Oropharynx as per pre-operative assessment

## 2022-08-06 ENCOUNTER — Encounter (HOSPITAL_BASED_OUTPATIENT_CLINIC_OR_DEPARTMENT_OTHER): Payer: Self-pay | Admitting: Orthopedic Surgery

## 2022-08-06 NOTE — Anesthesia Postprocedure Evaluation (Signed)
Anesthesia Post Note  Patient: Crystal Callahan  Procedure(s) Performed: LEFT OPEN REDUCTION INTERNAL FIXATION (ORIF) DISTAL RADIUS FRACTURE (Left: Wrist)     Patient location during evaluation: PACU Anesthesia Type: General and Regional Level of consciousness: awake and alert Pain management: pain level controlled Vital Signs Assessment: post-procedure vital signs reviewed and stable Respiratory status: spontaneous breathing, nonlabored ventilation, respiratory function stable and patient connected to nasal cannula oxygen Cardiovascular status: blood pressure returned to baseline and stable Postop Assessment: no apparent nausea or vomiting Anesthetic complications: no   No notable events documented.  Last Vitals:  Vitals:   08/05/22 1515 08/05/22 1540  BP: 124/82 118/79  Pulse: 91 87  Resp: 14 16  Temp:  (!) 36.2 C  SpO2: 92% 98%    Last Pain:  Vitals:   08/05/22 1540  TempSrc:   PainSc: 0-No pain                 Earl Lites P Tamikia Chowning

## 2022-08-20 DIAGNOSIS — S6292XA Unspecified fracture of left wrist and hand, initial encounter for closed fracture: Secondary | ICD-10-CM | POA: Diagnosis not present

## 2022-08-27 DIAGNOSIS — M25642 Stiffness of left hand, not elsewhere classified: Secondary | ICD-10-CM | POA: Diagnosis not present

## 2022-09-02 DIAGNOSIS — M25642 Stiffness of left hand, not elsewhere classified: Secondary | ICD-10-CM | POA: Diagnosis not present

## 2022-09-07 DIAGNOSIS — M25642 Stiffness of left hand, not elsewhere classified: Secondary | ICD-10-CM | POA: Diagnosis not present

## 2022-09-16 DIAGNOSIS — M25642 Stiffness of left hand, not elsewhere classified: Secondary | ICD-10-CM | POA: Diagnosis not present

## 2022-09-24 DIAGNOSIS — M25642 Stiffness of left hand, not elsewhere classified: Secondary | ICD-10-CM | POA: Diagnosis not present

## 2022-09-27 DIAGNOSIS — M25642 Stiffness of left hand, not elsewhere classified: Secondary | ICD-10-CM | POA: Diagnosis not present

## 2022-10-08 DIAGNOSIS — M25642 Stiffness of left hand, not elsewhere classified: Secondary | ICD-10-CM | POA: Diagnosis not present

## 2022-10-22 DIAGNOSIS — M25642 Stiffness of left hand, not elsewhere classified: Secondary | ICD-10-CM | POA: Diagnosis not present

## 2022-10-27 DIAGNOSIS — Z131 Encounter for screening for diabetes mellitus: Secondary | ICD-10-CM | POA: Diagnosis not present

## 2022-10-27 DIAGNOSIS — Z23 Encounter for immunization: Secondary | ICD-10-CM | POA: Diagnosis not present

## 2022-10-27 DIAGNOSIS — E041 Nontoxic single thyroid nodule: Secondary | ICD-10-CM | POA: Diagnosis not present

## 2022-10-27 DIAGNOSIS — Z0000 Encounter for general adult medical examination without abnormal findings: Secondary | ICD-10-CM | POA: Diagnosis not present

## 2022-10-27 DIAGNOSIS — E7800 Pure hypercholesterolemia, unspecified: Secondary | ICD-10-CM | POA: Diagnosis not present

## 2023-01-14 DIAGNOSIS — Z1211 Encounter for screening for malignant neoplasm of colon: Secondary | ICD-10-CM | POA: Diagnosis not present

## 2023-07-25 DIAGNOSIS — Z1331 Encounter for screening for depression: Secondary | ICD-10-CM | POA: Diagnosis not present

## 2023-07-25 DIAGNOSIS — G43719 Chronic migraine without aura, intractable, without status migrainosus: Secondary | ICD-10-CM | POA: Diagnosis not present

## 2023-07-27 DIAGNOSIS — Z1231 Encounter for screening mammogram for malignant neoplasm of breast: Secondary | ICD-10-CM | POA: Diagnosis not present

## 2023-07-27 DIAGNOSIS — R92333 Mammographic heterogeneous density, bilateral breasts: Secondary | ICD-10-CM | POA: Diagnosis not present

## 2023-08-18 DIAGNOSIS — R92341 Mammographic extreme density, right breast: Secondary | ICD-10-CM | POA: Diagnosis not present

## 2023-08-18 DIAGNOSIS — R920 Mammographic microcalcification found on diagnostic imaging of breast: Secondary | ICD-10-CM | POA: Diagnosis not present

## 2023-08-18 DIAGNOSIS — R928 Other abnormal and inconclusive findings on diagnostic imaging of breast: Secondary | ICD-10-CM | POA: Diagnosis not present

## 2023-11-28 DIAGNOSIS — Z1283 Encounter for screening for malignant neoplasm of skin: Secondary | ICD-10-CM | POA: Diagnosis not present

## 2023-11-28 DIAGNOSIS — D1801 Hemangioma of skin and subcutaneous tissue: Secondary | ICD-10-CM | POA: Diagnosis not present

## 2023-11-28 DIAGNOSIS — L814 Other melanin hyperpigmentation: Secondary | ICD-10-CM | POA: Diagnosis not present

## 2023-11-28 DIAGNOSIS — L821 Other seborrheic keratosis: Secondary | ICD-10-CM | POA: Diagnosis not present

## 2024-02-16 DIAGNOSIS — E034 Atrophy of thyroid (acquired): Secondary | ICD-10-CM | POA: Diagnosis not present

## 2024-02-16 DIAGNOSIS — F322 Major depressive disorder, single episode, severe without psychotic features: Secondary | ICD-10-CM | POA: Diagnosis not present

## 2024-02-16 DIAGNOSIS — E041 Nontoxic single thyroid nodule: Secondary | ICD-10-CM | POA: Diagnosis not present

## 2024-02-16 DIAGNOSIS — Z13220 Encounter for screening for lipoid disorders: Secondary | ICD-10-CM | POA: Diagnosis not present

## 2024-02-16 DIAGNOSIS — Z130 Encounter for screening for diseases of the blood and blood-forming organs and certain disorders involving the immune mechanism: Secondary | ICD-10-CM | POA: Diagnosis not present

## 2024-02-17 DIAGNOSIS — R928 Other abnormal and inconclusive findings on diagnostic imaging of breast: Secondary | ICD-10-CM | POA: Diagnosis not present

## 2024-02-17 DIAGNOSIS — R92331 Mammographic heterogeneous density, right breast: Secondary | ICD-10-CM | POA: Diagnosis not present

## 2024-02-21 DIAGNOSIS — F322 Major depressive disorder, single episode, severe without psychotic features: Secondary | ICD-10-CM | POA: Diagnosis not present

## 2024-02-21 DIAGNOSIS — E041 Nontoxic single thyroid nodule: Secondary | ICD-10-CM | POA: Diagnosis not present

## 2024-02-21 DIAGNOSIS — Z0189 Encounter for other specified special examinations: Secondary | ICD-10-CM | POA: Diagnosis not present

## 2024-02-21 DIAGNOSIS — G43909 Migraine, unspecified, not intractable, without status migrainosus: Secondary | ICD-10-CM | POA: Diagnosis not present

## 2024-03-27 DIAGNOSIS — Z1330 Encounter for screening examination for mental health and behavioral disorders, unspecified: Secondary | ICD-10-CM | POA: Diagnosis not present

## 2024-03-27 DIAGNOSIS — G43719 Chronic migraine without aura, intractable, without status migrainosus: Secondary | ICD-10-CM | POA: Diagnosis not present

## 2024-05-25 DIAGNOSIS — N289 Disorder of kidney and ureter, unspecified: Secondary | ICD-10-CM | POA: Diagnosis not present

## 2024-06-14 DIAGNOSIS — M25551 Pain in right hip: Secondary | ICD-10-CM | POA: Diagnosis not present

## 2024-06-27 DIAGNOSIS — R29898 Other symptoms and signs involving the musculoskeletal system: Secondary | ICD-10-CM | POA: Diagnosis not present

## 2024-06-27 DIAGNOSIS — M25551 Pain in right hip: Secondary | ICD-10-CM | POA: Diagnosis not present

## 2024-06-27 DIAGNOSIS — M25651 Stiffness of right hip, not elsewhere classified: Secondary | ICD-10-CM | POA: Diagnosis not present

## 2024-06-29 DIAGNOSIS — M25551 Pain in right hip: Secondary | ICD-10-CM | POA: Diagnosis not present

## 2024-06-29 DIAGNOSIS — M25651 Stiffness of right hip, not elsewhere classified: Secondary | ICD-10-CM | POA: Diagnosis not present

## 2024-06-29 DIAGNOSIS — R29898 Other symptoms and signs involving the musculoskeletal system: Secondary | ICD-10-CM | POA: Diagnosis not present

## 2024-07-04 DIAGNOSIS — M25651 Stiffness of right hip, not elsewhere classified: Secondary | ICD-10-CM | POA: Diagnosis not present

## 2024-07-04 DIAGNOSIS — R29898 Other symptoms and signs involving the musculoskeletal system: Secondary | ICD-10-CM | POA: Diagnosis not present

## 2024-07-04 DIAGNOSIS — M25551 Pain in right hip: Secondary | ICD-10-CM | POA: Diagnosis not present

## 2024-07-06 DIAGNOSIS — M25551 Pain in right hip: Secondary | ICD-10-CM | POA: Diagnosis not present

## 2024-07-06 DIAGNOSIS — M25651 Stiffness of right hip, not elsewhere classified: Secondary | ICD-10-CM | POA: Diagnosis not present

## 2024-07-06 DIAGNOSIS — R29898 Other symptoms and signs involving the musculoskeletal system: Secondary | ICD-10-CM | POA: Diagnosis not present

## 2024-07-11 DIAGNOSIS — M25651 Stiffness of right hip, not elsewhere classified: Secondary | ICD-10-CM | POA: Diagnosis not present

## 2024-07-11 DIAGNOSIS — M25551 Pain in right hip: Secondary | ICD-10-CM | POA: Diagnosis not present

## 2024-07-11 DIAGNOSIS — R29898 Other symptoms and signs involving the musculoskeletal system: Secondary | ICD-10-CM | POA: Diagnosis not present

## 2024-07-13 DIAGNOSIS — R29898 Other symptoms and signs involving the musculoskeletal system: Secondary | ICD-10-CM | POA: Diagnosis not present

## 2024-07-13 DIAGNOSIS — M25651 Stiffness of right hip, not elsewhere classified: Secondary | ICD-10-CM | POA: Diagnosis not present

## 2024-07-13 DIAGNOSIS — M25551 Pain in right hip: Secondary | ICD-10-CM | POA: Diagnosis not present

## 2024-08-01 DIAGNOSIS — U071 COVID-19: Secondary | ICD-10-CM | POA: Diagnosis not present

## 2024-09-07 DIAGNOSIS — R928 Other abnormal and inconclusive findings on diagnostic imaging of breast: Secondary | ICD-10-CM | POA: Diagnosis not present

## 2024-09-07 DIAGNOSIS — R92333 Mammographic heterogeneous density, bilateral breasts: Secondary | ICD-10-CM | POA: Diagnosis not present

## 2024-09-07 DIAGNOSIS — R921 Mammographic calcification found on diagnostic imaging of breast: Secondary | ICD-10-CM | POA: Diagnosis not present
